# Patient Record
Sex: Female | Born: 1985 | Race: White | Hispanic: No | Marital: Married | Smoking: Never smoker
Health system: Southern US, Community
[De-identification: ages and names within clinical notes are randomized; demographics above are authoritative.]

## PROBLEM LIST (undated history)

## (undated) DIAGNOSIS — O2341 Unspecified infection of urinary tract in pregnancy, first trimester: Secondary | ICD-10-CM

## (undated) DIAGNOSIS — K08409 Partial loss of teeth, unspecified cause, unspecified class: Secondary | ICD-10-CM

## (undated) HISTORY — DX: Unspecified infection of urinary tract in pregnancy, first trimester: O23.41

## (undated) HISTORY — PX: WISDOM TOOTH EXTRACTION: SHX21

## (undated) HISTORY — PX: NO PAST SURGERIES: SHX2092

---
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
## 2011-04-16 ENCOUNTER — Emergency Department (HOSPITAL_COMMUNITY)
Admission: EM | Admit: 2011-04-16 | Discharge: 2011-04-16 | Disposition: A | Discharge status: Home / Self Care | Payer: 59 | Attending: Emergency Medicine | Admitting: Emergency Medicine

## 2011-04-16 ENCOUNTER — Encounter (HOSPITAL_COMMUNITY): Payer: Self-pay | Admitting: Family Medicine

## 2011-04-16 DIAGNOSIS — O039 Complete or unspecified spontaneous abortion without complication: Secondary | ICD-10-CM | POA: Insufficient documentation

## 2011-04-16 DIAGNOSIS — R350 Frequency of micturition: Secondary | ICD-10-CM | POA: Insufficient documentation

## 2011-04-16 DIAGNOSIS — O26859 Spotting complicating pregnancy, unspecified trimester: Secondary | ICD-10-CM | POA: Insufficient documentation

## 2011-04-16 DIAGNOSIS — R300 Dysuria: Secondary | ICD-10-CM | POA: Insufficient documentation

## 2011-04-16 LAB — URINE MICROSCOPIC-ADD ON

## 2011-04-16 LAB — ABO/RH: ABO/RH(D): AB POS

## 2011-04-16 LAB — URINALYSIS, ROUTINE W REFLEX MICROSCOPIC
Bilirubin Urine: NEGATIVE
Glucose, UA: NEGATIVE mg/dL
Ketones, ur: NEGATIVE mg/dL
Specific Gravity, Urine: 1.029 (ref 1.005–1.030)
pH: 6 (ref 5.0–8.0)

## 2011-04-16 LAB — HCG, QUANTITATIVE, PREGNANCY: hCG, Beta Chain, Quant, S: 5 m[IU]/mL — ABNORMAL HIGH (ref <5)

## 2011-04-16 MED ORDER — ONDANSETRON HCL 8 MG PO TABS: 8.0000 mg | ORAL_TABLET | Freq: Three times a day (TID) | ORAL | Status: AC | PRN

## 2011-04-16 NOTE — ED Notes (Signed)
Per pt having bleeding that started last night. sts started slow and then became more heavy. sts some small clots. sts she was cramping initially but has stopped.

## 2011-04-16 NOTE — Discharge Instructions (Signed)
Take zofran as needed for nausea.   Follow up with your gynecologist this week if possible.  You may return to the ER if you have any other concerns.

## 2011-04-16 NOTE — ED Notes (Signed)
Phlebotomy at bedside to draw pt blood.

## 2011-04-16 NOTE — ED Provider Notes (Signed)
History     CSN: [id number]  Arrival date & time 04/16/11  0920   First MD Initiated Contact with Patient 04/16/11 [phone]      Chief Complaint  Patient presents with  . Possible Pregnancy  . Vaginal Bleeding    (Consider location/radiation/quality/duration/timing/severity/associated sxs/prior treatment) HPI History provided by pt.   Pt found out she was pregnant via serum hcg at gynecologist office last week.  LMP 03/10/11.  Developed spotting at mid-night last night.  Passed once small clot this morning.  Has not needed to wear anything other than a panty-liner. No associated abd/pelvic/back pain or other vaginal sx.  Has had malodorous urine, dysuria and increased frequency as well.  This is patient's first pregnancy.    History reviewed. No pertinent past medical history.  History reviewed. No pertinent past surgical history.  History reviewed. No pertinent family history.  History  Substance Use Topics  . Smoking status: Never Smoker   . Smokeless tobacco: Not on file  . Alcohol Use: No    OB History    Grav Para Term Preterm Abortions TAB SAB Ect Mult Living   1               Review of Systems  All other systems reviewed and are negative.    Allergies  Review of patient's allergies indicates no known allergies.  Home Medications   Current Outpatient Rx  Name Route Sig Dispense Refill  . PRENATAL MULTIVITAMIN CH Oral Take 1 tablet by mouth daily.      BP 132/79  Pulse 86  Temp(Src) 97.9 F (36.6 C) (Oral)  Resp 16  SpO2 99%  LMP 03/10/2011  Physical Exam  Nursing note and vitals reviewed. Constitutional: She is oriented to person, place, and time. She appears well-developed and well-nourished. No distress.  HENT:  Head: Normocephalic and atraumatic.  Eyes:       Normal appearance  Neck: Normal range of motion.  Cardiovascular: Normal rate and regular rhythm.   Pulmonary/Chest: Effort normal and breath sounds normal.  Abdominal: Soft. Bowel sounds  are normal. She exhibits no distension and no mass. There is no tenderness. There is no rebound and no guarding.       No CVA tenderness  Genitourinary:       Nml external genitalia.  Small amt of vaginal bleeding.  Cervix closed and appears nml.  No adnexal or cervical motion tenderess.    Neurological: She is alert and oriented to person, place, and time.  Skin: Skin is warm and dry. No rash noted.  Psychiatric: She has a normal mood and affect. Her behavior is normal.    ED Course  Procedures (including critical care time)   Labs Reviewed  URINALYSIS, ROUTINE W REFLEX MICROSCOPIC  PREGNANCY, URINE   No results found.   1. Complete abortion       MDM  25yo healthy, pregnant F (pos serum quant last week; approx 5wks 2d based on LMP) presents w/ spotting since mid-night last night.  No associated cramping.   On exam, abdomen benign/non-tender, cervix closed, small amt of vaginal bleeding.  U/A pos for bacteria but dirty sample and no WBCs or leukocyte esterase.  Pregnancy test neg. Will check a serum quant and if positive, order transvaginal US to verify IUP.  Suspect implantation bleeding. 10:53 AM   Quant 5 today.  Pt likely had a miscarriage.  Discussed results with her.  Blood type AB+.  Discharged home with zofran d/t recent nausea and  referred her back to her gynecologist for recheck.         Arie Sabina [place], [place] 04/16/11 2109

## 2011-04-20 NOTE — ED Provider Notes (Signed)
Medical screening examination/treatment/procedure(s) were performed by non-physician practitioner and as supervising physician I was immediately available for consultation/collaboration.    Celene Kras, MD 04/20/11 2024

## 2012-02-11 NOTE — L&D Delivery Note (Signed)
I have seen and examined this patient and I agree with the above. I was present for 2nd and 3rd stages. Mary [name] 7:17 AM 12/02/2012

## 2012-02-11 NOTE — L&D Delivery Note (Signed)
Delivery Note At 6:22 AM a viable female was delivered via Vaginal, Spontaneous Delivery (Presentation: ; Occiput Anterior).  APGAR: 8, 9; weight 7 lb 1.4 oz (3215 g).   Placenta status: intact, Spontaneous.  Cord:  with the following complications: None.  Cord pH: not sent  Anesthesia: Epidural  Episiotomy: None Lacerations: 2nd degree;Perineal Suture Repair: 3.0 vicryl Est. Blood Loss (mL): 250  Mom to postpartum.  Baby to nursery-stable. Pt permitted to labor down. Delivered a viable baby girl after several hard pushes with instantaneous cry. Delivery was complicated by nuchal X1 which was reduced. Baby at first appeared floppy but apgars were 8,9. Third stage of labor managed with manual traction and pitocin. 2nd degree perineal tear repaired with 3.0 vicyrl. Hemostasis was achieved. Pt and baby both stable for transfer to postpartum. Counts were reviewed and were correct.   Anselm Lis 12/02/2012, 6:56 AM

## 2012-02-16 LAB — OB RESULTS CONSOLE GC/CHLAMYDIA: Gonorrhea: NEGATIVE

## 2012-04-14 LAB — OB RESULTS CONSOLE PLATELET COUNT: Platelets: 265 10*3/uL

## 2012-04-14 LAB — OB RESULTS CONSOLE HGB/HCT, BLOOD: HCT: 36 %

## 2012-04-14 LAB — OB RESULTS CONSOLE ANTIBODY SCREEN: Antibody Screen: NEGATIVE

## 2012-04-14 LAB — OB RESULTS CONSOLE ABO/RH: RH Type: POSITIVE

## 2012-04-14 LAB — OB RESULTS CONSOLE VARICELLA ZOSTER ANTIBODY, IGG: Varicella: IMMUNE

## 2012-05-14 ENCOUNTER — Encounter: Payer: Self-pay | Admitting: Adult Health

## 2012-05-14 DIAGNOSIS — O2341 Unspecified infection of urinary tract in pregnancy, first trimester: Secondary | ICD-10-CM

## 2012-05-14 HISTORY — DX: Unspecified infection of urinary tract in pregnancy, first trimester: O23.41

## 2012-05-19 ENCOUNTER — Other Ambulatory Visit: Payer: Self-pay | Admitting: Obstetrics & Gynecology

## 2012-05-19 ENCOUNTER — Ambulatory Visit (INDEPENDENT_AMBULATORY_CARE_PROVIDER_SITE_OTHER): Payer: 59 | Admitting: Obstetrics and Gynecology

## 2012-05-19 ENCOUNTER — Encounter: Payer: Self-pay | Admitting: Obstetrics and Gynecology

## 2012-05-19 ENCOUNTER — Encounter: Payer: Self-pay | Admitting: Advanced Practice Midwife

## 2012-05-19 VITALS — BP 104/70 | Wt 162.0 lb

## 2012-05-19 DIAGNOSIS — Z3490 Encounter for supervision of normal pregnancy, unspecified, unspecified trimester: Secondary | ICD-10-CM

## 2012-05-19 DIAGNOSIS — Z36 Encounter for antenatal screening of mother: Secondary | ICD-10-CM

## 2012-05-19 DIAGNOSIS — Z3491 Encounter for supervision of normal pregnancy, unspecified, first trimester: Secondary | ICD-10-CM

## 2012-05-19 DIAGNOSIS — O2390 Unspecified genitourinary tract infection in pregnancy, unspecified trimester: Secondary | ICD-10-CM

## 2012-05-19 DIAGNOSIS — O09299 Supervision of pregnancy with other poor reproductive or obstetric history, unspecified trimester: Secondary | ICD-10-CM

## 2012-05-19 DIAGNOSIS — O210 Mild hyperemesis gravidarum: Secondary | ICD-10-CM

## 2012-05-19 LAB — POCT URINALYSIS DIPSTICK
Ketones, UA: NEGATIVE
Protein, UA: NEGATIVE

## 2012-05-19 NOTE — Progress Notes (Signed)
Persistent n&V, but tolerating it well with lemonade and crackers.candy.

## 2012-05-19 NOTE — Progress Notes (Signed)
C/o nausea and vomiting

## 2012-05-26 ENCOUNTER — Encounter: Payer: Self-pay | Admitting: *Deleted

## 2012-06-02 ENCOUNTER — Other Ambulatory Visit: Payer: 59

## 2012-06-16 ENCOUNTER — Ambulatory Visit (INDEPENDENT_AMBULATORY_CARE_PROVIDER_SITE_OTHER): Payer: 59 | Admitting: Advanced Practice Midwife

## 2012-06-16 ENCOUNTER — Encounter: Payer: Self-pay | Admitting: Advanced Practice Midwife

## 2012-06-16 VITALS — BP 110/70 | Wt 161.5 lb

## 2012-06-16 DIAGNOSIS — Z3480 Encounter for supervision of other normal pregnancy, unspecified trimester: Secondary | ICD-10-CM | POA: Insufficient documentation

## 2012-06-16 DIAGNOSIS — Z3490 Encounter for supervision of normal pregnancy, unspecified, unspecified trimester: Secondary | ICD-10-CM

## 2012-06-16 DIAGNOSIS — O09299 Supervision of pregnancy with other poor reproductive or obstetric history, unspecified trimester: Secondary | ICD-10-CM

## 2012-06-16 DIAGNOSIS — Z1389 Encounter for screening for other disorder: Secondary | ICD-10-CM

## 2012-06-16 DIAGNOSIS — Z331 Pregnant state, incidental: Secondary | ICD-10-CM

## 2012-06-16 LAB — POCT URINALYSIS DIPSTICK
Glucose, UA: NEGATIVE
Nitrite, UA: NEGATIVE
Protein, UA: NEGATIVE

## 2012-06-16 NOTE — Progress Notes (Signed)
No c/o at this time. Feeling better with nausea.  Routine questions about pregnancy answered.  F/U in 5 weeks for anatomy scan and LROB.

## 2012-07-21 ENCOUNTER — Ambulatory Visit (INDEPENDENT_AMBULATORY_CARE_PROVIDER_SITE_OTHER): Payer: 59

## 2012-07-21 ENCOUNTER — Ambulatory Visit (INDEPENDENT_AMBULATORY_CARE_PROVIDER_SITE_OTHER): Payer: 59 | Admitting: Advanced Practice Midwife

## 2012-07-21 ENCOUNTER — Encounter: Payer: Self-pay | Admitting: Advanced Practice Midwife

## 2012-07-21 ENCOUNTER — Other Ambulatory Visit: Payer: Self-pay | Admitting: Advanced Practice Midwife

## 2012-07-21 VITALS — BP 104/80 | Wt 161.5 lb

## 2012-07-21 DIAGNOSIS — Z1389 Encounter for screening for other disorder: Secondary | ICD-10-CM

## 2012-07-21 DIAGNOSIS — O09299 Supervision of pregnancy with other poor reproductive or obstetric history, unspecified trimester: Secondary | ICD-10-CM

## 2012-07-21 DIAGNOSIS — Z3490 Encounter for supervision of normal pregnancy, unspecified, unspecified trimester: Secondary | ICD-10-CM

## 2012-07-21 DIAGNOSIS — Z331 Pregnant state, incidental: Secondary | ICD-10-CM

## 2012-07-21 LAB — POCT URINALYSIS DIPSTICK: Nitrite, UA: NEGATIVE

## 2012-07-21 NOTE — Progress Notes (Signed)
Having indigestion. TUMS makes her sick. Advised Mylanta.

## 2012-07-21 NOTE — Progress Notes (Signed)
U/s performed. Anatomy Screen Complete. All anat appears nml, nml fluid, cx closed =3.1 cm trans-abd., active, meas. C/w dates, ant high plac., bilat ovs seen, female fetus

## 2012-07-21 NOTE — Progress Notes (Signed)
Had anatomy scan today--normal scan, see notes.    No c/o at this time except indigestion.  Routine questions about pregnancy answered.  F/U in 4 weeks for LROB.

## 2012-07-26 DIAGNOSIS — Z029 Encounter for administrative examinations, unspecified: Secondary | ICD-10-CM

## 2012-08-17 ENCOUNTER — Encounter: Payer: Self-pay | Admitting: Women's Health

## 2012-08-17 ENCOUNTER — Ambulatory Visit (INDEPENDENT_AMBULATORY_CARE_PROVIDER_SITE_OTHER): Payer: 59 | Admitting: Women's Health

## 2012-08-17 VITALS — BP 100/60 | Wt 162.8 lb

## 2012-08-17 DIAGNOSIS — Z331 Pregnant state, incidental: Secondary | ICD-10-CM

## 2012-08-17 DIAGNOSIS — Z3490 Encounter for supervision of normal pregnancy, unspecified, unspecified trimester: Secondary | ICD-10-CM

## 2012-08-17 DIAGNOSIS — Z3482 Encounter for supervision of other normal pregnancy, second trimester: Secondary | ICD-10-CM

## 2012-08-17 DIAGNOSIS — Z1389 Encounter for screening for other disorder: Secondary | ICD-10-CM

## 2012-08-17 DIAGNOSIS — O1212 Gestational proteinuria, second trimester: Secondary | ICD-10-CM

## 2012-08-17 DIAGNOSIS — O09299 Supervision of pregnancy with other poor reproductive or obstetric history, unspecified trimester: Secondary | ICD-10-CM

## 2012-08-17 LAB — POCT URINALYSIS DIPSTICK: Nitrite, UA: NEGATIVE

## 2012-08-17 MED ORDER — PRENATAL MULTIVITAMIN CH: 1.0000 | ORAL_TABLET | Freq: Every day | ORAL | Status: DC

## 2012-08-17 NOTE — Progress Notes (Signed)
Boil on bottom. Bee sting on right wrist Saturday.

## 2012-08-17 NOTE — Progress Notes (Signed)
Reports good fm. Denies uc's, lof, vb, urinary frequency, urgency, hesitancy, or dysuria.  Bee sting Rt inner forearm- itches- recommended benadryl po or cream.  Boil Rt lower labia- app 2-3cm, already draining. Continue warm compresses. Reviewed ptl s/s, fm.  All questions answered. F/U in 4wks for PN2 and visit.

## 2012-08-17 NOTE — Patient Instructions (Addendum)
You will have your sugar test next visit.  Please do not eat or drink anything after midnight the night before you come, not even water.  You will be here for at least two hours.     Abscess An abscess is an infected area that contains a collection of pus and debris.It can occur in almost any part of the body. An abscess is also known as a furuncle or boil. CAUSES  An abscess occurs when tissue gets infected. This can occur from blockage of oil or sweat glands, infection of hair follicles, or a minor injury to the skin. As the body tries to fight the infection, pus collects in the area and creates pressure under the skin. This pressure causes pain. People with weakened immune systems have difficulty fighting infections and get certain abscesses more often.  SYMPTOMS Usually an abscess develops on the skin and becomes a painful mass that is red, warm, and tender. If the abscess forms under the skin, you may feel a moveable soft area under the skin. Some abscesses break open (rupture) on their own, but most will continue to get worse without care. The infection can spread deeper into the body and eventually into the bloodstream, causing you to feel ill.  DIAGNOSIS  Your caregiver will take your medical history and perform a physical exam. A sample of fluid may also be taken from the abscess to determine what is causing your infection. TREATMENT  Your caregiver may prescribe antibiotic medicines to fight the infection. However, taking antibiotics alone usually does not cure an abscess. Your caregiver may need to make a small cut (incision) in the abscess to drain the pus. In some cases, gauze is packed into the abscess to reduce pain and to continue draining the area. HOME CARE INSTRUCTIONS   Only take over-the-counter or prescription medicines for pain, discomfort, or fever as directed by your caregiver.  If you were prescribed antibiotics, take them as directed. Finish them even if you start to feel  better.  If gauze is used, follow your caregiver's directions for changing the gauze.  To avoid spreading the infection:  Keep your draining abscess covered with a bandage.  Wash your hands well.  Do not share personal care items, towels, or whirlpools with others.  Avoid skin contact with others.  Keep your skin and clothes clean around the abscess.  Keep all follow-up appointments as directed by your caregiver. SEEK MEDICAL CARE IF:   You have increased pain, swelling, redness, fluid drainage, or bleeding.  You have muscle aches, chills, or a general ill feeling.  You have a fever. MAKE SURE YOU:   Understand these instructions.  Will watch your condition.  Will get help right away if you are not doing well or get worse. Document Released: 11/06/2004 Document Revised: 07/29/2011 Document Reviewed: 04/11/2011 [facility] Patient Information 2014 [place], [place].

## 2012-08-18 LAB — URINALYSIS
Bilirubin Urine: NEGATIVE
Hgb urine dipstick: NEGATIVE
Ketones, ur: NEGATIVE mg/dL
Specific Gravity, Urine: 1.022 (ref 1.005–1.030)
Urobilinogen, UA: 1 mg/dL (ref 0.0–1.0)

## 2012-08-19 ENCOUNTER — Encounter: Payer: Self-pay | Admitting: Women's Health

## 2012-08-19 LAB — URINE CULTURE

## 2012-09-14 ENCOUNTER — Encounter: Payer: Self-pay | Admitting: Obstetrics & Gynecology

## 2012-09-14 ENCOUNTER — Other Ambulatory Visit: Payer: 59

## 2012-09-14 ENCOUNTER — Ambulatory Visit (INDEPENDENT_AMBULATORY_CARE_PROVIDER_SITE_OTHER): Payer: 59 | Admitting: Obstetrics & Gynecology

## 2012-09-14 VITALS — BP 100/60 | Wt 168.0 lb

## 2012-09-14 DIAGNOSIS — Z3480 Encounter for supervision of other normal pregnancy, unspecified trimester: Secondary | ICD-10-CM

## 2012-09-14 DIAGNOSIS — O09299 Supervision of pregnancy with other poor reproductive or obstetric history, unspecified trimester: Secondary | ICD-10-CM

## 2012-09-14 DIAGNOSIS — Z331 Pregnant state, incidental: Secondary | ICD-10-CM

## 2012-09-14 DIAGNOSIS — Z3482 Encounter for supervision of other normal pregnancy, second trimester: Secondary | ICD-10-CM

## 2012-09-14 DIAGNOSIS — Z1389 Encounter for screening for other disorder: Secondary | ICD-10-CM

## 2012-09-14 LAB — POCT URINALYSIS DIPSTICK
Blood, UA: NEGATIVE
Ketones, UA: NEGATIVE

## 2012-09-14 LAB — CBC
Hemoglobin: 11 g/dL — ABNORMAL LOW (ref 12.0–15.0)
MCV: 90.7 fL (ref 78.0–100.0)
Platelets: 221 10*3/uL (ref 150–400)
RBC: 3.55 MIL/uL — ABNORMAL LOW (ref 3.87–5.11)
WBC: 9.2 10*3/uL (ref 4.0–10.5)

## 2012-09-14 NOTE — Progress Notes (Signed)
FOR PN2 TODAY. HAVING SOME SWELLING IN LEFT FOOT.

## 2012-09-14 NOTE — Progress Notes (Signed)
Normal amount of variable swelling on left side. BP weight and urine results all reviewed and noted. Patient reports good fetal movement, denies any bleeding and no rupture of membranes symptoms or regular contractions. Patient is without complaints. All questions were answered.

## 2012-09-14 NOTE — Patient Instructions (Signed)
Breastfeeding A change in hormones during your pregnancy causes growth of your breast tissue and an increase in number and size of milk ducts. The hormone prolactin allows proteins, sugars, and fats from your blood supply to make breast milk in your milk-producing glands. The hormone progesterone prevents breast milk from being released before the birth of your baby. After the birth of your baby, your progesterone level decreases allowing breast milk to be released. Thoughts of your baby, as well as his or her sucking or crying, can stimulate the release of milk from the milk-producing glands. Deciding to breastfeed (nurse) is one of the best choices you can make for you and your baby. The information that follows gives a brief review of the benefits, as well as other important skills to know about breastfeeding. BENEFITS OF BREASTFEEDING For your baby  The first milk (colostrum) helps your baby's digestive system function better.   There are antibodies in your milk that help your baby fight off infections.   Your baby has a lower incidence of asthma, allergies, and sudden infant death syndrome (SIDS).   The nutrients in breast milk are better for your baby than infant formulas.  Breast milk improves your baby's brain development.   Your baby will have less gas, colic, and constipation.  Your baby is less likely to develop other conditions, such as childhood obesity, asthma, or diabetes mellitus. For you  Breastfeeding helps develop a very special bond between you and your baby.   Breastfeeding is convenient, always available at the correct temperature, and costs nothing.   Breastfeeding helps to burn calories and helps you lose the weight gained during pregnancy.   Breastfeeding makes your uterus contract back down to normal size faster and slows bleeding following delivery.   Breastfeeding mothers have a lower risk of developing osteoporosis or breast or ovarian cancer later in  life.  BREASTFEEDING FREQUENCY  A healthy, full-term baby may breastfeed as often as every hour or space his or her feedings to every 3 hours. Breastfeeding frequency will vary from baby to baby.   Newborns should be fed no less than every 2 3 hours during the day and every 4 5 hours during the night. You should breastfeed a minimum of 8 feedings in a 24 hour period.  Awaken your baby to breastfeed if it has been 3 4 hours since the last feeding.  Breastfeed when you feel the need to reduce the fullness of your breasts or when your newborn shows signs of hunger. Signs that your baby may be hungry include:  Increased alertness or activity.  Stretching.  Movement of the head from side to side.  Movement of the head and opening of the mouth when the corner of the mouth or cheek is stroked (rooting).  Increased sucking sounds, smacking lips, cooing, sighing, or squeaking.  Hand-to-mouth movements.  Increased sucking of fingers or hands.  Fussing.  Intermittent crying.  Signs of extreme hunger will require calming and consoling before you try to feed your baby. Signs of extreme hunger may include:  Restlessness.  A loud, strong cry.  Screaming.  Frequent feeding will help you make more milk and will help prevent problems, such as sore nipples and engorgement of the breasts.  BREASTFEEDING   Whether lying down or sitting, be sure that the baby's abdomen is facing your abdomen.   Support your breast with 4 fingers under your breast and your thumb above your nipple. Make sure your fingers are well away from  your nipple and your baby's mouth.   Stroke your baby's lips gently with your finger or nipple.   When your baby's mouth is open wide enough, place all of your nipple and as much of the colored area around your nipple (areola) as possible into your baby's mouth.  More areola should be visible above his or her upper lip than below his or her lower lip.  Your baby's  tongue should be between his or her lower gum and your breast.  Ensure that your baby's mouth is correctly positioned around the nipple (latched). Your baby's lips should create a seal on your breast.  Signs that your baby has effectively latched onto your nipple include:  Tugging or sucking without pain.  Swallowing heard between sucks.  Absent click or smacking sound.  Muscle movement above and in front of his or her ears with sucking.  Your baby must suck about 2 3 minutes in order to get your milk. Allow your baby to feed on each breast as long as he or she wants. Nurse your baby until he or she unlatches or falls asleep at the first breast, then offer the second breast.  Signs that your baby is full and satisfied include:  A gradual decrease in the number of sucks or complete cessation of sucking.  Falling asleep.  Extension or relaxation of his or her body.  Retention of a small amount of milk in his or her mouth.  Letting go of your breast by himself or herself.  Signs of effective breastfeeding in you include:  Breasts that have increased firmness, weight, and size prior to feeding.  Breasts that are softer after nursing.  Increased milk volume, as well as a change in milk consistency and color by the 5th day of breastfeeding.  Breast fullness relieved by breastfeeding.  Nipples are not sore, cracked, or bleeding.  If needed, break the suction by putting your finger into the corner of your baby's mouth and sliding your finger between his or her gums. Then, remove your breast from his or her mouth.  It is common for babies to spit up a small amount after a feeding.  Babies often swallow air during feeding. This can make babies fussy. Burping your baby between breasts can help with this.  Vitamin D supplements are recommended for babies who get only breast milk.  Avoid using a pacifier during your baby's first 4 6 weeks.  Avoid supplemental feedings of water,  formula, or juice in place of breastfeeding. Breast milk is all the food your baby needs. It is not necessary for your baby to have water or formula. Your breasts will make more milk if supplemental feedings are avoided during the early weeks. HOW TO TELL WHETHER YOUR BABY IS GETTING ENOUGH BREAST MILK Wondering whether or not your baby is getting enough milk is a common concern among mothers. You can be assured that your baby is getting enough milk if:   Your baby is actively sucking and you hear swallowing.   Your baby seems relaxed and satisfied after a feeding.   Your baby nurses at least 8 12 times in a 24 hour time period.  During the first 49 40 days of age:  Your baby is wetting at least 3 5 diapers in a 24 hour period. The urine should be clear and pale yellow.  Your baby is having at least 3 4 stools in a 24 hour period. The stool should be soft and yellow.  At  44 25 days of age, your baby is having at least 3 6 stools in a 24 hour period. The stool should be seedy and yellow by 44 days of age.  Your baby has a weight loss less than 7 10% during the first 78 days of age.  Your baby does not lose weight after 71 65 days of age.  Your baby gains 4 7 ounces each week after he or she is 35 days of age.  Your baby gains weight by 3 days of age and is back to birth weight within 2 weeks. ENGORGEMENT In the first week after your baby is born, you may experience extremely full breasts (engorgement). When engorged, your breasts may feel heavy, warm, or tender to the touch. Engorgement peaks within 24 48 hours after delivery of your baby.  Engorgement may be reduced by:  Continuing to breastfeed.  Increasing the frequency of breastfeeding.  Taking warm showers or applying warm, moist heat to your breasts just before each feeding. This increases circulation and helps the milk flow.   Gently massaging your breast before and during the feedings. With your fingertips, massage from your  chest wall towards your nipple in a circular motion.   Ensuring that your baby empties at least one breast at every feeding. It also helps to start the next feeding on the opposite breast.   Expressing breast milk by hand or by using a breast pump to empty the breasts if your baby is sleepy, or not nursing well. You may also want to express milk if you are returning to work oryou feel you are getting engorged.  Ensuring your baby is latched on and positioned properly while breastfeeding. If you follow these suggestions, your engorgement should improve in 24 48 hours. If you are still experiencing difficulty, call your lactation consultant or caregiver.  CARING FOR YOURSELF Take care of your breasts.  Bathe or shower daily.   Avoid using soap on your nipples.   Wear a supportive bra. Avoid wearing underwire style bras.  Air dry your nipples for a 3 after each feeding.   Use only cotton bra pads to absorb breast milk leakage. Leaking of breast milk between feedings is normal.   Use only pure lanolin on your nipples after nursing. You do not need to wash it off before feeding your baby again. Another option is to express a few drops of breast milk and gently massage that milk into your nipples.  Continue breast self-awareness checks. Take care of yourself.  Eat healthy foods. Alternate 3 meals with 3 snacks.  Avoid foods that you notice affect your baby in a bad way.  Drink milk, fruit juice, and water to satisfy your thirst (about 8 glasses a day).   Rest often, relax, and take your prenatal vitamins to prevent fatigue, stress, and anemia.  Avoid chewing and smoking tobacco.  Avoid alcohol and drug use.  Take over-the-counter and prescribed medicine only as directed by your caregiver or pharmacist. You should always check with your caregiver or pharmacist before taking any new medicine, vitamin, or herbal supplement.  Know that pregnancy is possible while  breastfeeding. If desired, talk to your caregiver about family planning and safe birth control methods that may be used while breastfeeding. SEEK MEDICAL CARE IF:   You feel like you want to stop breastfeeding or have become frustrated with breastfeeding.  You have painful breasts or nipples.  Your nipples are cracked or bleeding.  Your breasts are red, tender,  or warm.  You have a swollen area on either breast.  You have a fever or chills.  You have nausea or vomiting.  You have drainage from your nipples.  Your breasts do not become full before feedings by the 5th day after delivery.  You feel sad and depressed.  Your baby is too sleepy to eat well.  Your baby is having trouble sleeping.   Your baby is wetting less than 3 diapers in a 24 hour period.  Your baby has less than 3 stools in a 24 hour period.  Your baby's skin or the white part of his or her eyes becomes more yellow.   Your baby is not gaining weight by 34 days of age. MAKE SURE YOU:   Understand these instructions.  Will watch your condition.  Will get help right away if you are not doing well or get worse. Document Released: 01/27/2005 Document Revised: 10/22/2011 Document Reviewed: 09/03/2011 [facility] Patient Information 2014 [place], [place].

## 2012-09-14 NOTE — Addendum Note (Signed)
Addended by: Malachy Mood S on: 09/14/2012 11:30 AM   Modules accepted: Orders

## 2012-09-15 LAB — RPR

## 2012-09-15 LAB — GLUCOSE TOLERANCE, 2 HOURS W/ 1HR
Glucose, 1 hour: 78 mg/dL (ref 70–170)
Glucose, 2 hour: 114 mg/dL (ref 70–139)
Glucose, Fasting: 76 mg/dL (ref 70–99)

## 2012-09-23 ENCOUNTER — Encounter: Payer: Self-pay | Admitting: Obstetrics and Gynecology

## 2012-09-23 DIAGNOSIS — R768 Other specified abnormal immunological findings in serum: Secondary | ICD-10-CM | POA: Insufficient documentation

## 2012-10-05 ENCOUNTER — Encounter: Payer: 59 | Admitting: Women's Health

## 2012-10-07 ENCOUNTER — Encounter: Payer: Self-pay | Admitting: Advanced Practice Midwife

## 2012-10-07 ENCOUNTER — Ambulatory Visit (INDEPENDENT_AMBULATORY_CARE_PROVIDER_SITE_OTHER): Payer: 59 | Admitting: Advanced Practice Midwife

## 2012-10-07 VITALS — BP 100/64 | Wt 171.8 lb

## 2012-10-07 DIAGNOSIS — O99019 Anemia complicating pregnancy, unspecified trimester: Secondary | ICD-10-CM

## 2012-10-07 DIAGNOSIS — Z331 Pregnant state, incidental: Secondary | ICD-10-CM

## 2012-10-07 DIAGNOSIS — R768 Other specified abnormal immunological findings in serum: Secondary | ICD-10-CM

## 2012-10-07 DIAGNOSIS — O09299 Supervision of pregnancy with other poor reproductive or obstetric history, unspecified trimester: Secondary | ICD-10-CM

## 2012-10-07 DIAGNOSIS — Z3482 Encounter for supervision of other normal pregnancy, second trimester: Secondary | ICD-10-CM

## 2012-10-07 DIAGNOSIS — Z1389 Encounter for screening for other disorder: Secondary | ICD-10-CM

## 2012-10-07 LAB — POCT URINALYSIS DIPSTICK
Blood, UA: NEGATIVE
Glucose, UA: NEGATIVE
Ketones, UA: NEGATIVE

## 2012-10-07 NOTE — Progress Notes (Addendum)
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  Offered testing to him, as well, if interested. Tick bite on right flank 2 days ago.  No rash, fever.  Precautions given.   Routine questions about pregnancy answered.  F/U in 2 weeks for LROB.

## 2012-10-21 ENCOUNTER — Ambulatory Visit (INDEPENDENT_AMBULATORY_CARE_PROVIDER_SITE_OTHER): Payer: Self-pay | Admitting: Advanced Practice Midwife

## 2012-10-21 VITALS — BP 118/74 | Wt 174.0 lb

## 2012-10-21 DIAGNOSIS — Z1389 Encounter for screening for other disorder: Secondary | ICD-10-CM

## 2012-10-21 DIAGNOSIS — O99019 Anemia complicating pregnancy, unspecified trimester: Secondary | ICD-10-CM

## 2012-10-21 DIAGNOSIS — O09299 Supervision of pregnancy with other poor reproductive or obstetric history, unspecified trimester: Secondary | ICD-10-CM

## 2012-10-21 DIAGNOSIS — Z3482 Encounter for supervision of other normal pregnancy, second trimester: Secondary | ICD-10-CM

## 2012-10-21 LAB — POCT URINALYSIS DIPSTICK
Blood, UA: NEGATIVE
Nitrite, UA: NEGATIVE

## 2012-10-21 MED ORDER — ACYCLOVIR 400 MG PO TABS: 400.0000 mg | ORAL_TABLET | Freq: Three times a day (TID) | ORAL | Status: DC

## 2012-10-21 MED ORDER — PRENATAL MULTIVITAMIN CH: 1.0000 | ORAL_TABLET | Freq: Every day | ORAL | Status: DC

## 2012-10-21 NOTE — Progress Notes (Signed)
Will get flu shot at work.   Wants to go ahead and start taking acyclovir.    No c/o at this time.  Routine questions about pregnancy answered.  F/U in 2 weeks for LROB.

## 2012-10-21 NOTE — Addendum Note (Signed)
Addended by: Richardson Chiquito on: 10/21/2012 12:42 PM   Modules accepted: Orders

## 2012-11-03 ENCOUNTER — Ambulatory Visit (INDEPENDENT_AMBULATORY_CARE_PROVIDER_SITE_OTHER): Payer: Self-pay | Admitting: Advanced Practice Midwife

## 2012-11-03 ENCOUNTER — Encounter: Payer: Self-pay | Admitting: Advanced Practice Midwife

## 2012-11-03 VITALS — BP 102/78 | Wt 176.5 lb

## 2012-11-03 DIAGNOSIS — O99019 Anemia complicating pregnancy, unspecified trimester: Secondary | ICD-10-CM

## 2012-11-03 DIAGNOSIS — Z331 Pregnant state, incidental: Secondary | ICD-10-CM

## 2012-11-03 DIAGNOSIS — Z1389 Encounter for screening for other disorder: Secondary | ICD-10-CM

## 2012-11-03 DIAGNOSIS — R768 Other specified abnormal immunological findings in serum: Secondary | ICD-10-CM

## 2012-11-03 DIAGNOSIS — O09299 Supervision of pregnancy with other poor reproductive or obstetric history, unspecified trimester: Secondary | ICD-10-CM

## 2012-11-03 LAB — POCT URINALYSIS DIPSTICK
Blood, UA: NEGATIVE
Glucose, UA: NEGATIVE
Ketones, UA: NEGATIVE

## 2012-11-03 NOTE — Progress Notes (Signed)
Pt. Will still get flu shot at work. Continues to have indigestion and back pain. She does have times where she feels nauseated enough to throw up. She does take zantac for her heartburn and it seems to help a bit. F/U in 2 weeks for LROB.  Visit performed by Baron Sane, PA-S

## 2012-11-17 ENCOUNTER — Ambulatory Visit (INDEPENDENT_AMBULATORY_CARE_PROVIDER_SITE_OTHER): Payer: Self-pay | Admitting: Obstetrics and Gynecology

## 2012-11-17 ENCOUNTER — Encounter: Payer: Self-pay | Admitting: Obstetrics and Gynecology

## 2012-11-17 VITALS — BP 100/60 | Temp 98.4°F | Wt 179.0 lb

## 2012-11-17 DIAGNOSIS — N390 Urinary tract infection, site not specified: Secondary | ICD-10-CM

## 2012-11-17 DIAGNOSIS — Z1389 Encounter for screening for other disorder: Secondary | ICD-10-CM

## 2012-11-17 DIAGNOSIS — O2390 Unspecified genitourinary tract infection in pregnancy, unspecified trimester: Secondary | ICD-10-CM

## 2012-11-17 LAB — POCT URINALYSIS DIPSTICK
Ketones, UA: NEGATIVE
Leukocytes, UA: NEGATIVE

## 2012-11-17 MED ORDER — CEPHALEXIN 500 MG PO CAPS: 500.0000 mg | ORAL_CAPSULE | Freq: Four times a day (QID) | ORAL | Status: DC

## 2012-11-17 NOTE — Patient Instructions (Signed)
Culture ordered, will be back in 5 days. Begin keflex 4 times daily

## 2012-11-17 NOTE — Progress Notes (Signed)
Mid-back discomfort at end of long workdays in [place] [employer]. -CVAT, u/a +  trace protein 2+ leukocytes no fever IMP; musculoskeletal discomforts , not Pyelo           R/o uti   Will do C&S, begin Keflex as precaution

## 2012-11-17 NOTE — Progress Notes (Signed)
C/C Back Pain x 1 week.

## 2012-11-18 LAB — URINALYSIS
Nitrite: POSITIVE — AB
Protein, ur: NEGATIVE mg/dL
Urobilinogen, UA: 1 mg/dL (ref 0.0–1.0)

## 2012-11-20 LAB — URINE CULTURE: Colony Count: >=100000

## 2012-11-23 ENCOUNTER — Ambulatory Visit (INDEPENDENT_AMBULATORY_CARE_PROVIDER_SITE_OTHER): Payer: 59 | Admitting: Women's Health

## 2012-11-23 ENCOUNTER — Ambulatory Visit (INDEPENDENT_AMBULATORY_CARE_PROVIDER_SITE_OTHER): Payer: 59

## 2012-11-23 ENCOUNTER — Other Ambulatory Visit: Payer: Self-pay | Admitting: Women's Health

## 2012-11-23 VITALS — BP 102/70 | Wt 175.0 lb

## 2012-11-23 DIAGNOSIS — O9989 Other specified diseases and conditions complicating pregnancy, childbirth and the puerperium: Secondary | ICD-10-CM

## 2012-11-23 DIAGNOSIS — O0993 Supervision of high risk pregnancy, unspecified, third trimester: Secondary | ICD-10-CM

## 2012-11-23 DIAGNOSIS — Z3480 Encounter for supervision of other normal pregnancy, unspecified trimester: Secondary | ICD-10-CM

## 2012-11-23 DIAGNOSIS — Z3483 Encounter for supervision of other normal pregnancy, third trimester: Secondary | ICD-10-CM

## 2012-11-23 DIAGNOSIS — O288 Other abnormal findings on antenatal screening of mother: Secondary | ICD-10-CM | POA: Insufficient documentation

## 2012-11-23 LAB — OB RESULTS CONSOLE GC/CHLAMYDIA
Chlamydia: NEGATIVE
Gonorrhea: NEGATIVE

## 2012-11-23 NOTE — Progress Notes (Signed)
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  Reports good fm, denies uc's, vb, lof.  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 Strip reviewed w/ [facility], no need for further monitoring at [facility], to get AFI. AFI=4.3cm, also scanned by [facility]. Reviewed warning s/s to report, labor s/s, fkc. F/U Fri for NST and visit, then Tues for afi/bpp u/s and visit.

## 2012-11-23 NOTE — Patient Instructions (Signed)
Fetal Movement Counts Patient Name: __________________________________________________ Patient Due Date: ____________________ Performing a fetal movement count is highly recommended in high-risk pregnancies, but it is good for every pregnant woman to do. Your caregiver may ask you to start counting fetal movements at 28 weeks of the pregnancy. Fetal movements often increase:  After eating a full meal.  After physical activity.  After eating or drinking something sweet or cold.  At rest. Pay attention to when you feel the baby is most active. This will help you notice a pattern of your baby's sleep and wake cycles and what factors contribute to an increase in fetal movement. It is important to perform a fetal movement count at the same time each day when your baby is normally most active.  HOW TO COUNT FETAL MOVEMENTS 1. Find a quiet and comfortable area to sit or lie down on your left side. Lying on your left side provides the best blood and oxygen circulation to your baby. 2. Write down the day and time on a sheet of paper or in a journal. 3. Start counting kicks, flutters, swishes, rolls, or jabs in a 2 hour period. You should feel at least 10 movements within 2 hours. 4. If you do not feel 10 movements in 2 hours, wait 2 3 hours and count again. Look for a change in the pattern or not enough counts in 2 hours. SEEK MEDICAL CARE IF:  You feel less than 10 counts in 2 hours, tried twice.  There is no movement in over an hour.  The pattern is changing or taking longer each day to reach 10 counts in 2 hours.  You feel the baby is not moving as he or she usually does. Date: ____________ Movements: ____________ Start time: ____________ Finish time: ____________  Date: ____________ Movements: ____________ Start time: ____________ Finish time: ____________ Date: ____________ Movements: ____________ Start time: ____________ Finish time: ____________ Date: ____________ Movements: ____________  Start time: ____________ Finish time: ____________ Date: ____________ Movements: ____________ Start time: ____________ Finish time: ____________ Date: ____________ Movements: ____________ Start time: ____________ Finish time: ____________ Date: ____________ Movements: ____________ Start time: ____________ Finish time: ____________ Date: ____________ Movements: ____________ Start time: ____________ Finish time: ____________  Date: ____________ Movements: ____________ Start time: ____________ Finish time: ____________ Date: ____________ Movements: ____________ Start time: ____________ Finish time: ____________ Date: ____________ Movements: ____________ Start time: ____________ Finish time: ____________ Date: ____________ Movements: ____________ Start time: ____________ Finish time: ____________ Date: ____________ Movements: ____________ Start time: ____________ Finish time: ____________ Date: ____________ Movements: ____________ Start time: ____________ Finish time: ____________ Date: ____________ Movements: ____________ Start time: ____________ Finish time: ____________  Date: ____________ Movements: ____________ Start time: ____________ Finish time: ____________ Date: ____________ Movements: ____________ Start time: ____________ Finish time: ____________ Date: ____________ Movements: ____________ Start time: ____________ Finish time: ____________ Date: ____________ Movements: ____________ Start time: ____________ Finish time: ____________ Date: ____________ Movements: ____________ Start time: ____________ Finish time: ____________ Date: ____________ Movements: ____________ Start time: ____________ Finish time: ____________ Date: ____________ Movements: ____________ Start time: ____________ Finish time: ____________  Date: ____________ Movements: ____________ Start time: ____________ Finish time: ____________ Date: ____________ Movements: ____________ Start time: ____________ Finish time:  ____________ Date: ____________ Movements: ____________ Start time: ____________ Finish time: ____________ Date: ____________ Movements: ____________ Start time: ____________ Finish time: ____________ Date: ____________ Movements: ____________ Start time: ____________ Finish time: ____________ Date: ____________ Movements: ____________ Start time: ____________ Finish time: ____________ Date: ____________ Movements: ____________ Start time: ____________ Finish time: ____________  Date: ____________ Movements: ____________ Start time: ____________ Finish   time: ____________ Date: ____________ Movements: ____________ Start time: ____________ Finish time: ____________ Date: ____________ Movements: ____________ Start time: ____________ Finish time: ____________ Date: ____________ Movements: ____________ Start time: ____________ Finish time: ____________ Date: ____________ Movements: ____________ Start time: ____________ Finish time: ____________ Date: ____________ Movements: ____________ Start time: ____________ Finish time: ____________ Date: ____________ Movements: ____________ Start time: ____________ Finish time: ____________  Date: ____________ Movements: ____________ Start time: ____________ Finish time: ____________ Date: ____________ Movements: ____________ Start time: ____________ Finish time: ____________ Date: ____________ Movements: ____________ Start time: ____________ Finish time: ____________ Date: ____________ Movements: ____________ Start time: ____________ Finish time: ____________ Date: ____________ Movements: ____________ Start time: ____________ Finish time: ____________ Date: ____________ Movements: ____________ Start time: ____________ Finish time: ____________ Date: ____________ Movements: ____________ Start time: ____________ Finish time: ____________  Date: ____________ Movements: ____________ Start time: ____________ Finish time: ____________ Date: ____________ Movements:  ____________ Start time: ____________ Finish time: ____________ Date: ____________ Movements: ____________ Start time: ____________ Finish time: ____________ Date: ____________ Movements: ____________ Start time: ____________ Finish time: ____________ Date: ____________ Movements: ____________ Start time: ____________ Finish time: ____________ Date: ____________ Movements: ____________ Start time: ____________ Finish time: ____________ Date: ____________ Movements: ____________ Start time: ____________ Finish time: ____________  Date: ____________ Movements: ____________ Start time: ____________ Finish time: ____________ Date: ____________ Movements: ____________ Start time: ____________ Finish time: ____________ Date: ____________ Movements: ____________ Start time: ____________ Finish time: ____________ Date: ____________ Movements: ____________ Start time: ____________ Finish time: ____________ Date: ____________ Movements: ____________ Start time: ____________ Finish time: ____________ Date: ____________ Movements: ____________ Start time: ____________ Finish time: ____________ Document Released: 02/26/2006 Document Revised: 01/14/2012 Document Reviewed: 11/24/2011 [employer] Patient Information 2014 [employer]  

## 2012-11-23 NOTE — Progress Notes (Signed)
Pt fell getting out of her vehicle, pt states fell on her back, no vaginal bleeding, +FM. C/o lower back pain. NST today.

## 2012-11-23 NOTE — Progress Notes (Signed)
U/S(37+1wks)-vtx active female fetus, AFI-4.3 and 4.8cm, (borderline oligohydramnios), EFW 6 lb 8 oz (38th%tile) anterior placenta Gr 3

## 2012-11-23 NOTE — Progress Notes (Signed)
U/S(37+1wks)-vtx, active fetus, EFW 6 lb 8 oz (38th%tile), AFI-4.3 & 4.8cm oligohydramnios, ant gr 2 plac, female fetus

## 2012-11-24 ENCOUNTER — Encounter: Payer: 59 | Admitting: Women's Health

## 2012-11-24 ENCOUNTER — Encounter: Payer: Self-pay | Admitting: Women's Health

## 2012-11-24 LAB — GC/CHLAMYDIA PROBE AMP
CT Probe RNA: NEGATIVE
GC Probe RNA: NEGATIVE

## 2012-11-25 ENCOUNTER — Encounter: Payer: Self-pay | Admitting: Women's Health

## 2012-11-25 LAB — STREP B DNA PROBE: GBSP: POSITIVE

## 2012-11-26 ENCOUNTER — Ambulatory Visit (INDEPENDENT_AMBULATORY_CARE_PROVIDER_SITE_OTHER): Payer: 59 | Admitting: Obstetrics & Gynecology

## 2012-11-26 ENCOUNTER — Telehealth: Payer: Self-pay | Admitting: Obstetrics & Gynecology

## 2012-11-26 VITALS — BP 110/80 | Wt 184.0 lb

## 2012-11-26 DIAGNOSIS — Z1389 Encounter for screening for other disorder: Secondary | ICD-10-CM

## 2012-11-26 DIAGNOSIS — O9989 Other specified diseases and conditions complicating pregnancy, childbirth and the puerperium: Secondary | ICD-10-CM

## 2012-11-26 DIAGNOSIS — O09299 Supervision of pregnancy with other poor reproductive or obstetric history, unspecified trimester: Secondary | ICD-10-CM

## 2012-11-26 DIAGNOSIS — O98519 Other viral diseases complicating pregnancy, unspecified trimester: Secondary | ICD-10-CM

## 2012-11-26 DIAGNOSIS — O99019 Anemia complicating pregnancy, unspecified trimester: Secondary | ICD-10-CM

## 2012-11-26 DIAGNOSIS — O4190X1 Disorder of amniotic fluid and membranes, unspecified, unspecified trimester, fetus 1: Secondary | ICD-10-CM

## 2012-11-26 DIAGNOSIS — Z331 Pregnant state, incidental: Secondary | ICD-10-CM

## 2012-11-26 LAB — POCT URINALYSIS DIPSTICK
Blood, UA: NEGATIVE
Glucose, UA: NEGATIVE
Ketones, UA: NEGATIVE
Nitrite, UA: NEGATIVE

## 2012-11-26 NOTE — Progress Notes (Signed)
Reactive NST sono tuesday BP weight and urine results all reviewed and noted. Patient reports good fetal movement, denies any bleeding and no rupture of membranes symptoms or regular contractions. Patient is without complaints. All questions were answered.

## 2012-11-26 NOTE — Progress Notes (Deleted)
FOR NST.

## 2012-11-26 NOTE — Progress Notes (Deleted)
C/o sharp pain in back and upper abdomen, NST today

## 2012-11-26 NOTE — Telephone Encounter (Signed)
Note fixed per Dr. Despina Hidden and pt aware to come by office and pick up.

## 2012-11-29 ENCOUNTER — Other Ambulatory Visit: Payer: Self-pay | Admitting: Obstetrics & Gynecology

## 2012-11-29 DIAGNOSIS — O4103X1 Oligohydramnios, third trimester, fetus 1: Secondary | ICD-10-CM

## 2012-11-30 ENCOUNTER — Ambulatory Visit (INDEPENDENT_AMBULATORY_CARE_PROVIDER_SITE_OTHER): Payer: 59

## 2012-11-30 ENCOUNTER — Inpatient Hospital Stay (HOSPITAL_COMMUNITY)
Admission: AD | Admit: 2012-11-30 | Discharge: 2012-12-03 | DRG: 775 | Disposition: A | Discharge status: Home / Self Care | Payer: 59 | Source: Ambulatory Visit | Attending: Obstetrics and Gynecology | Admitting: Obstetrics and Gynecology

## 2012-11-30 ENCOUNTER — Encounter: Payer: Self-pay | Admitting: Obstetrics & Gynecology

## 2012-11-30 ENCOUNTER — Encounter (HOSPITAL_COMMUNITY): Payer: Self-pay

## 2012-11-30 ENCOUNTER — Ambulatory Visit (INDEPENDENT_AMBULATORY_CARE_PROVIDER_SITE_OTHER): Payer: 59 | Admitting: Obstetrics & Gynecology

## 2012-11-30 VITALS — BP 100/80 | Wt 181.0 lb

## 2012-11-30 DIAGNOSIS — Z22330 Carrier of Group B streptococcus: Secondary | ICD-10-CM

## 2012-11-30 DIAGNOSIS — O4100X1 Oligohydramnios, unspecified trimester, fetus 1: Secondary | ICD-10-CM

## 2012-11-30 DIAGNOSIS — O9989 Other specified diseases and conditions complicating pregnancy, childbirth and the puerperium: Secondary | ICD-10-CM

## 2012-11-30 DIAGNOSIS — O98519 Other viral diseases complicating pregnancy, unspecified trimester: Secondary | ICD-10-CM

## 2012-11-30 DIAGNOSIS — O4100X0 Oligohydramnios, unspecified trimester, not applicable or unspecified: Secondary | ICD-10-CM | POA: Insufficient documentation

## 2012-11-30 DIAGNOSIS — R768 Other specified abnormal immunological findings in serum: Secondary | ICD-10-CM

## 2012-11-30 DIAGNOSIS — Z3483 Encounter for supervision of other normal pregnancy, third trimester: Secondary | ICD-10-CM

## 2012-11-30 DIAGNOSIS — O2390 Unspecified genitourinary tract infection in pregnancy, unspecified trimester: Secondary | ICD-10-CM

## 2012-11-30 DIAGNOSIS — O288 Other abnormal findings on antenatal screening of mother: Secondary | ICD-10-CM

## 2012-11-30 DIAGNOSIS — O4103X1 Oligohydramnios, third trimester, fetus 1: Secondary | ICD-10-CM

## 2012-11-30 DIAGNOSIS — O09299 Supervision of pregnancy with other poor reproductive or obstetric history, unspecified trimester: Secondary | ICD-10-CM

## 2012-11-30 DIAGNOSIS — O210 Mild hyperemesis gravidarum: Secondary | ICD-10-CM

## 2012-11-30 DIAGNOSIS — O99892 Other specified diseases and conditions complicating childbirth: Secondary | ICD-10-CM | POA: Diagnosis present

## 2012-11-30 DIAGNOSIS — Z1389 Encounter for screening for other disorder: Secondary | ICD-10-CM

## 2012-11-30 HISTORY — DX: Partial loss of teeth, unspecified cause, unspecified class: K08.409

## 2012-11-30 LAB — POCT URINALYSIS DIPSTICK
Blood, UA: NEGATIVE
Ketones, UA: NEGATIVE
Protein, UA: NEGATIVE

## 2012-11-30 LAB — CBC
Hemoglobin: 10.6 g/dL — ABNORMAL LOW (ref 12.0–15.0)
MCV: 86 fL (ref 78.0–100.0)
Platelets: 232 10*3/uL (ref 150–400)
RBC: 3.49 MIL/uL — ABNORMAL LOW (ref 3.87–5.11)
RDW: 13.3 % (ref 11.5–15.5)
WBC: 11.3 10*3/uL — ABNORMAL HIGH (ref 4.0–10.5)

## 2012-11-30 MED ORDER — MISOPROSTOL 25 MCG QUARTER TABLET
25.0000 ug | ORAL_TABLET | ORAL | Status: DC | PRN
  Administered 2012-11-30 – 2012-12-01 (×2): 25 ug via VAGINAL
  Filled 2012-11-30 (×2): qty 0.25
  Filled 2012-11-30: qty 1

## 2012-11-30 MED ORDER — OXYCODONE-ACETAMINOPHEN 5-325 MG PO TABS: 1.0000 | ORAL_TABLET | ORAL | Status: DC | PRN

## 2012-11-30 MED ORDER — ONDANSETRON HCL 4 MG/2ML IJ SOLN: 4.0000 mg | Freq: Four times a day (QID) | INTRAMUSCULAR | Status: DC | PRN

## 2012-11-30 MED ORDER — LACTATED RINGERS IV SOLN
500.0000 mL | INTRAVENOUS | Status: DC | PRN
  Administered 2012-12-01: 300 mL via INTRAVENOUS

## 2012-11-30 MED ORDER — ACETAMINOPHEN 325 MG PO TABS: 650.0000 mg | ORAL_TABLET | ORAL | Status: DC | PRN

## 2012-11-30 MED ORDER — CITRIC ACID-SODIUM CITRATE 334-500 MG/5ML PO SOLN: 30.0000 mL | ORAL | Status: DC | PRN

## 2012-11-30 MED ORDER — LACTATED RINGERS IV SOLN
INTRAVENOUS | Status: DC
  Administered 2012-12-01 – 2012-12-02 (×5): via INTRAVENOUS

## 2012-11-30 MED ORDER — TERBUTALINE SULFATE 1 MG/ML IJ SOLN: 0.2500 mg | Freq: Once | INTRAMUSCULAR | Status: AC | PRN

## 2012-11-30 MED ORDER — IBUPROFEN 600 MG PO TABS
600.0000 mg | ORAL_TABLET | Freq: Four times a day (QID) | ORAL | Status: DC | PRN
  Administered 2012-12-02: 600 mg via ORAL
  Filled 2012-11-30: qty 1

## 2012-11-30 MED ORDER — OXYTOCIN 40 UNITS IN LACTATED RINGERS INFUSION - SIMPLE MED
62.5000 mL/h | INTRAVENOUS | Status: DC
  Filled 2012-11-30: qty 1000

## 2012-11-30 MED ORDER — LIDOCAINE HCL (PF) 1 % IJ SOLN
30.0000 mL | INTRAMUSCULAR | Status: DC | PRN
  Filled 2012-11-30 (×2): qty 30

## 2012-11-30 MED ORDER — PENICILLIN G POTASSIUM 5000000 UNITS IJ SOLR
2.5000 10*6.[IU] | INTRAVENOUS | Status: DC
  Administered 2012-12-01 – 2012-12-02 (×4): 2.5 10*6.[IU] via INTRAVENOUS
  Filled 2012-11-30 (×10): qty 2.5

## 2012-11-30 MED ORDER — PENICILLIN G POTASSIUM 5000000 UNITS IJ SOLR
5.0000 10*6.[IU] | Freq: Once | INTRAVENOUS | Status: AC
  Administered 2012-12-01: 5 10*6.[IU] via INTRAVENOUS
  Filled 2012-11-30: qty 5

## 2012-11-30 MED ORDER — OXYTOCIN BOLUS FROM INFUSION
500.0000 mL | INTRAVENOUS | Status: DC
  Administered 2012-12-02: 500 mL via INTRAVENOUS

## 2012-11-30 NOTE — Progress Notes (Addendum)
U/S(38+1wks)- vtx non-active fetus BPP 4/8 (unable to view tone and limb movement), UA Doppler RI 0.57 & 0.54, AFI-4.5cm, ant gr 3 plac, female fetus, appears to be NUCHAL CORDx1

## 2012-11-30 NOTE — Progress Notes (Signed)
D. Poe notified of admission status, SVE, FHR tracing, Ucs, and positive GBS. Provider to place orders

## 2012-11-30 NOTE — Progress Notes (Signed)
BPP 4/8(no gross body, no extension/flexion) AFI 4.58(Pocket 2.71) being monitored for idiopathic oligohydramnios picked up as part of an evaluation after a fall.  Has had reassuring testing until now and has excellent Doppler flow today.  Discussed with Dr Lyn Records behalf of Dr Erin Fulling) as well as Leola Brazil, CNM and they all agree with cervical ripening followed by induction.

## 2012-11-30 NOTE — Progress Notes (Signed)
No complaints today.  Feeling well.  No loss of fluid, vaginal bleeding, abnormal discharge.  She reports irregular UC.   Oligo on [place], BPP 4/8.   Patient to go to [facility] for induction.

## 2012-11-30 NOTE — Progress Notes (Signed)
Follow-Up U/S.

## 2012-11-30 NOTE — H&P (Signed)
Mary [name] is a 27 y.o. female G2P0010 at [redacted]w[redacted]d presenting for cervical ripening/IOL indicated by oligohydramnios. PNC at [facility] with FATs for borderline idiopathic oligohydramnios and had [place] today. BPP 4/8 and AFI 4.58, normal Dopplers.    Maternal Medical History:  Reason for admission: Nausea. oligo  Contractions: none  Fetal activity: Perceived fetal activity is normal.    Prenatal complications: Oligohydramnios.   Prenatal Complications - Diabetes: none.    OB History   Grav Para Term Preterm Abortions TAB SAB Ect Mult Living   2    1  1         Past Medical History  Diagnosis Date  . UTI (urinary tract infection) in pregnancy in first trimester 05/14/2012  . Wisdom teeth extracted 2007   Past Surgical History  Procedure Laterality Date  . No past surgeries     Family History: family history includes Asthma in her father; COPD in her father; Cancer in her maternal grandfather; Diabetes in her mother; Hypertension in her mother; Stroke in her maternal grandfather. Social History:  reports that she has never smoked. She has never used smokeless tobacco. She reports that she does not drink alcohol or use illicit drugs.   Prenatal Transfer Tool  Maternal Diabetes: No Genetic Screening: Normal Maternal Ultrasounds/Referrals: Normal Fetal Ultrasounds or other Referrals:  None Maternal Substance Abuse:  No Significant Maternal Medications:  Meds include: Other: PCN IV in labor Significant Maternal Lab Results:  Lab values include: Group B Strep positive Other Comments:  None  Review of Systems  Constitutional: Negative for fever.  Respiratory: Negative for cough.   Gastrointestinal: Negative for heartburn, nausea and abdominal pain.  Genitourinary: Negative for dysuria.  Neurological: Negative for headaches.  Psychiatric/Behavioral: Negative for depression.      Blood pressure 110/80, pulse 83, temperature 98.6 F (37 C), temperature source Oral, resp. rate 18, height  5\' 3"  (1.6 m), weight 82.101 kg (181 lb), last menstrual period 03/04/2012. Maternal Exam:  Uterine Assessment: Contraction strength is mild.  Contraction frequency is rare.   Abdomen: Estimated fetal weight is 7#.   Fetal presentation: vertex  Introitus: Ferning test: not done.  Nitrazine test: not done. Amniotic fluid character: not assessed.  Pelvis: adequate for delivery.   Cervix: Cx L/C per Dr. Despina Hidden today  Fetal Exam Fetal Monitor Review: Mode: ultrasound.   Baseline rate: 130.  Variability: moderate (6-25 bpm).   Pattern: accelerations present and no decelerations.    Fetal State Assessment: Category I - tracings are normal.     Physical Exam  Constitutional: She is oriented to person, place, and time. She appears well-developed and well-nourished.  HENT:  Head: Normocephalic.  Eyes: Pupils are equal, round, and reactive to light.  Neck: Normal range of motion. No thyromegaly present.  Cardiovascular: Normal rate, regular rhythm and normal heart sounds.   Respiratory: Effort normal and breath sounds normal.  GI: Soft. There is no tenderness.  Musculoskeletal: Normal range of motion.  Neurological: She is alert and oriented to person, place, and time. She has normal reflexes.  Skin: Skin is warm and dry.  Psychiatric: She has a normal mood and affect. Her behavior is normal. Judgment and thought content normal.    Prenatal labs: ABO, Rh: AB/Positive/-- (03/05 0000) Antibody: NEG (08/05 0948) Rubella: Nonimmune (03/05 0000) RPR: NON REAC (08/05 0948)  HBsAg: Negative (03/05 0000)  HIV: NON REACTIVE (08/05 0948)  GBS: POSITIVE (10/14 1555)  2 hr glucola: 76-78-114  Assessment/Plan: G2P0010 at [redacted]w[redacted]d Oligohydramnios Unfavorable cx>  proceed with cytotec cx ripening   Mary [name] 11/30/2012, 7:26 PM

## 2012-11-30 NOTE — Progress Notes (Signed)
Mary [name] is a 27 y.o. G2P0010 at [redacted]w[redacted]d.  Subjective: No UC's  Objective: BP 123/74  Pulse 80  Temp(Src) 99.4 F (37.4 C) (Oral)  Resp 18  Ht 5\' 3"  (1.6 m)  Wt 82.101 kg (181 lb)  BMI 32.07 kg/m2  LMP 03/04/2012      FHT:  FHR: 140 bpm, variability: moderate,  accelerations:  Present,  decelerations:  Present few mild variables UC:   none SVE:   Dilation: Fingertip Effacement (%): Thick Station: -2 Exam by:: A.Davis,RN  Labs: Lab Results  Component Value Date   WBC 11.3* 11/30/2012   HGB 10.6* 11/30/2012   HCT 30.0* 11/30/2012   MCV 86.0 11/30/2012   PLT 232 11/30/2012    Assessment / Plan: Induction of labor due to oligo,  progressing well on pitocin  Labor: IOL w/ cytotec. Preeclampsia:  NA Fetal Wellbeing:  Category I Pain Control:  Labor support without medications I/D:  n/a Anticipated MOD:  NSVD  Mary [name] 11/30/2012, 10:12 PM

## 2012-11-30 NOTE — H&P (Signed)
Mary [name] is a 27 y.o. female G2P0010 at [redacted]w[redacted]d presenting for cervical ripening/IOL indicated by oligohydramnios. [facility]NC at [facility] with FATs for borderline idiopathic oligohydramnios and had [place] today. BPP 4/8 (-2  History OB History   Grav Para Term Preterm Abortions TAB SAB Ect Mult Living   2    1  1         Past Medical History  Diagnosis Date  . UTI (urinary tract infection) in pregnancy in first trimester 05/14/2012  . Wisdom teeth extracted 2007   Past Surgical History  Procedure Laterality Date  . No past surgeries     Family History: family history includes Asthma in her father; COPD in her father; Cancer in her maternal grandfather; Diabetes in her mother; Hypertension in her mother; Stroke in her maternal grandfather. Social History:  reports that she has never smoked. She has never used smokeless tobacco. She reports that she does not drink alcohol or use illicit drugs.   Prenatal Transfer Tool  Maternal Diabetes: No Genetic Screening: Normal Maternal Ultrasounds/Referrals: Normal Fetal Ultrasounds or other Referrals:  None Maternal Substance Abuse:  No Significant Maternal Medications:  None Significant Maternal Lab Results:  None Other Comments:  None  ROS    Blood pressure 110/80, pulse 83, temperature 98.6 F (37 C), temperature source Oral, resp. rate 18, height 5\' 3"  (1.6 m), weight 82.101 kg (181 lb), last menstrual period 03/04/2012. Exam Physical Exam  Prenatal labs: ABO, Rh: AB/Positive/-- (03/05 0000) Antibody: NEG (08/05 0948) Rubella: Nonimmune (03/05 0000) RPR: NON REAC (08/05 0948)  HBsAg: Negative (03/05 0000)  HIV: NON REACTIVE (08/05 0948)  GBS: POSITIVE (10/14 1555)    Assessment/Plan: Oligohydramnios at term>admit for cx ripening/IOL   Mary [name] 11/30/2012, 7:02 PM

## 2012-12-01 ENCOUNTER — Encounter (HOSPITAL_COMMUNITY): Payer: Self-pay

## 2012-12-01 MED ORDER — ZOLPIDEM TARTRATE 5 MG PO TABS
5.0000 mg | ORAL_TABLET | Freq: Every evening | ORAL | Status: DC | PRN
  Administered 2012-12-01 (×2): 5 mg via ORAL
  Filled 2012-12-01 (×2): qty 1

## 2012-12-01 MED ORDER — OXYTOCIN 40 UNITS IN LACTATED RINGERS INFUSION - SIMPLE MED
1.0000 m[IU]/min | INTRAVENOUS | Status: DC
  Administered 2012-12-01: 2 m[IU]/min via INTRAVENOUS

## 2012-12-01 MED ORDER — TERBUTALINE SULFATE 1 MG/ML IJ SOLN: 0.2500 mg | Freq: Once | INTRAMUSCULAR | Status: AC | PRN

## 2012-12-01 NOTE — Progress Notes (Signed)
Mary [name] is a 27 y.o. G2P0010 at [redacted]w[redacted]d  admitted for induction of labor due to Low amniotic fluid..  Subjective:  Pt doing well. Not feeling contractions very much. +FM.   Objective: BP 119/77  Pulse 59  Temp(Src) 98.4 F (36.9 C) (Oral)  Resp 18  Ht 5\' 3"  (1.6 m)  Wt 82.101 kg (181 lb)  BMI 32.07 kg/m2  LMP 03/04/2012      FHT:  FHR: 140 bpm, variability: moderate,  accelerations:  Present,  decelerations:  Absent UC:   irregular, every 2-5 minutes SVE:   Dilation: 1 Effacement (%): 20 Station: -2 Exam by:: Dr. Michail Jewels  Labs: Lab Results  Component Value Date   WBC 11.3* 11/30/2012   HGB 10.6* 11/30/2012   HCT 30.0* 11/30/2012   MCV 86.0 11/30/2012   PLT 232 11/30/2012    Assessment / Plan: IOL due to oligo. s/p cytotec x2. FB in place  Labor: contracting on her own with foley bulb in place. cont to monitor Fetal Wellbeing:  Category I Pain Control:  Labor support without medications I/D:  GBS +- will start PCN in active labor Anticipated MOD:  NSVD  Mary [name] 12/01/2012, 10:41 AM

## 2012-12-01 NOTE — Progress Notes (Signed)
Clelia Croft, CNM notified of FHR tracing and uc pattern, no further orders at this time

## 2012-12-01 NOTE — Progress Notes (Signed)
Mary [name] is a 27 y.o. G2P0010 at [redacted]w[redacted]d  admitted for IOL due to oligo  Subjective:  FB just fell out. No vb, lof.  +FM. Having irregular contractions.   Objective: BP 117/77  Pulse 74  Temp(Src) 98.8 F (37.1 C) (Oral)  Resp 18  Ht 5\' 3"  (1.6 m)  Wt 82.101 kg (181 lb)  BMI 32.07 kg/m2  LMP 03/04/2012      FHT:  FHR: 145 bpm, variability: moderate,  accelerations:  Present,  decelerations:  Present occasional variable UC:   irregular SVE:   Dilation: 3 Effacement (%): 50 Station: -3 Exam by:: Reola Calkins, MD  Labs: Lab Results  Component Value Date   WBC 11.3* 11/30/2012   HGB 10.6* 11/30/2012   HCT 30.0* 11/30/2012   MCV 86.0 11/30/2012   PLT 232 11/30/2012    Assessment / Plan: IOL due to oligo, s/p foley bulb and cytotec.   Labor: start pitocin 2x2.   Fetal Wellbeing:  Category I Pain Control:  Epidural I/D:  GBS +- will start PCN now Anticipated MOD:  NSVD  Mary [name] 12/01/2012, 2:13 PM

## 2012-12-01 NOTE — Progress Notes (Signed)
Mary [name] is a 27 y.o. G2P0010 at [redacted]w[redacted]d   Subjective: Mostly comfortable; beginning to feel a little crampy  Objective: BP 126/81  Pulse 87  Temp(Src) 98.5 F (36.9 C) (Oral)  Resp 20  Ht 5\' 3"  (1.6 m)  Wt 82.101 kg (181 lb)  BMI 32.07 kg/m2  LMP 03/04/2012      FHT:  FHR: 130 bpm, variability: moderate,  accelerations:  Present,  decelerations:  Absent UC:   irregular, every 3-6 minutes with Pitocin @ 12 mu/min SVE:   Dilation: 4 Effacement (%): 50 Station: -2 Exam by:: Renaldo Harrison, RN- exam deferred  Labs: Lab Results  Component Value Date   WBC 11.3* 11/30/2012   HGB 10.6* 11/30/2012   HCT 30.0* 11/30/2012   MCV 86.0 11/30/2012   PLT 232 11/30/2012    Assessment / Plan: IOL process Oligo  Continue to increase Pitocin to achieve reg ctx  Cam Hai 12/01/2012, 8:09 PM

## 2012-12-01 NOTE — Progress Notes (Signed)
Mary [name] is a 27 y.o. G2P0010 at [redacted]w[redacted]d admitted for IOL 2/2 oligo  Subjective: No complaints, resting quietly in bed, not feeling contraction pain  Objective: BP 122/78  Pulse 85  Temp(Src) 99.1 F (37.3 C) (Oral)  Resp 18  Ht 5\' 3"  (1.6 m)  Wt 82.101 kg (181 lb)  BMI 32.07 kg/m2  LMP 03/04/2012      FHT:  FHR: 145 bpm, variability: moderate,  accelerations:  Present,  decelerations:  Absent UC:   irregular, every 4-6 minutes SVE:   Dilation: 1 Effacement (%): 20 Station: -2 Exam by:: Dr. Michail Jewels  Labs: Lab Results  Component Value Date   WBC 11.3* 11/30/2012   HGB 10.6* 11/30/2012   HCT 30.0* 11/30/2012   MCV 86.0 11/30/2012   PLT 232 11/30/2012    Assessment / Plan: Induction of labor due to oligo  Labor: early stages, s/p vaginal cytotec Preeclampsia:  n/a Fetal Wellbeing:  Category I Pain Control:  Labor support without medications I/D:  n/a, plan to start PCN if SROM, onset of active labor Anticipated MOD:  NSVD  Anselm Lis 12/01/2012, 12:38 AM

## 2012-12-02 ENCOUNTER — Encounter (HOSPITAL_COMMUNITY): Payer: Self-pay | Admitting: Anesthesiology

## 2012-12-02 ENCOUNTER — Encounter (HOSPITAL_COMMUNITY): Payer: 59 | Admitting: Anesthesiology

## 2012-12-02 ENCOUNTER — Inpatient Hospital Stay (HOSPITAL_COMMUNITY): Payer: 59 | Admitting: Anesthesiology

## 2012-12-02 DIAGNOSIS — O9989 Other specified diseases and conditions complicating pregnancy, childbirth and the puerperium: Secondary | ICD-10-CM

## 2012-12-02 DIAGNOSIS — O4100X0 Oligohydramnios, unspecified trimester, not applicable or unspecified: Secondary | ICD-10-CM

## 2012-12-02 MED ORDER — DIBUCAINE 1 % RE OINT: 1.0000 "application " | TOPICAL_OINTMENT | RECTAL | Status: DC | PRN

## 2012-12-02 MED ORDER — FENTANYL 2.5 MCG/ML BUPIVACAINE 1/10 % EPIDURAL INFUSION (WH - ANES)
INTRAMUSCULAR | Status: DC | PRN
  Administered 2012-12-02: 14 mL/h via EPIDURAL

## 2012-12-02 MED ORDER — PHENYLEPHRINE 40 MCG/ML (10ML) SYRINGE FOR IV PUSH (FOR BLOOD PRESSURE SUPPORT)
80.0000 ug | PREFILLED_SYRINGE | INTRAVENOUS | Status: DC | PRN
  Filled 2012-12-02: qty 5

## 2012-12-02 MED ORDER — PHENYLEPHRINE 40 MCG/ML (10ML) SYRINGE FOR IV PUSH (FOR BLOOD PRESSURE SUPPORT): 80.0000 ug | PREFILLED_SYRINGE | INTRAVENOUS | Status: DC | PRN

## 2012-12-02 MED ORDER — LIDOCAINE HCL (PF) 1 % IJ SOLN
INTRAMUSCULAR | Status: DC | PRN
  Administered 2012-12-02 (×2): 4 mL

## 2012-12-02 MED ORDER — FENTANYL 2.5 MCG/ML BUPIVACAINE 1/10 % EPIDURAL INFUSION (WH - ANES)
14.0000 mL/h | INTRAMUSCULAR | Status: DC | PRN
  Filled 2012-12-02: qty 125

## 2012-12-02 MED ORDER — ONDANSETRON HCL 4 MG PO TABS: 4.0000 mg | ORAL_TABLET | ORAL | Status: DC | PRN

## 2012-12-02 MED ORDER — OXYCODONE-ACETAMINOPHEN 5-325 MG PO TABS: 1.0000 | ORAL_TABLET | ORAL | Status: DC | PRN

## 2012-12-02 MED ORDER — TETANUS-DIPHTH-ACELL PERTUSSIS 5-2.5-18.5 LF-MCG/0.5 IM SUSP
0.5000 mL | Freq: Once | INTRAMUSCULAR | Status: AC
  Administered 2012-12-03: 0.5 mL via INTRAMUSCULAR

## 2012-12-02 MED ORDER — SENNOSIDES-DOCUSATE SODIUM 8.6-50 MG PO TABS
2.0000 | ORAL_TABLET | ORAL | Status: DC
  Filled 2012-12-02: qty 2

## 2012-12-02 MED ORDER — EPHEDRINE 5 MG/ML INJ
10.0000 mg | INTRAVENOUS | Status: DC | PRN
  Filled 2012-12-02: qty 4

## 2012-12-02 MED ORDER — LACTATED RINGERS IV SOLN: 500.0000 mL | Freq: Once | INTRAVENOUS | Status: DC

## 2012-12-02 MED ORDER — LANOLIN HYDROUS EX OINT: TOPICAL_OINTMENT | CUTANEOUS | Status: DC | PRN

## 2012-12-02 MED ORDER — EPHEDRINE 5 MG/ML INJ: 10.0000 mg | INTRAVENOUS | Status: DC | PRN

## 2012-12-02 MED ORDER — COMPLETENATE 29-1 MG PO CHEW
1.0000 | CHEWABLE_TABLET | Freq: Every day | ORAL | Status: DC
  Administered 2012-12-02: 1 via ORAL
  Filled 2012-12-02 (×3): qty 1

## 2012-12-02 MED ORDER — IBUPROFEN 100 MG/5ML PO SUSP
600.0000 mg | Freq: Four times a day (QID) | ORAL | Status: DC
  Administered 2012-12-02: 600 mg via ORAL
  Filled 2012-12-02 (×8): qty 30

## 2012-12-02 MED ORDER — PRENATAL MULTIVITAMIN CH
1.0000 | ORAL_TABLET | Freq: Every day | ORAL | Status: DC
  Filled 2012-12-02: qty 1

## 2012-12-02 MED ORDER — SIMETHICONE 80 MG PO CHEW: 80.0000 mg | CHEWABLE_TABLET | ORAL | Status: DC | PRN

## 2012-12-02 MED ORDER — BENZOCAINE-MENTHOL 20-0.5 % EX AERO
1.0000 "application " | INHALATION_SPRAY | CUTANEOUS | Status: DC | PRN
  Filled 2012-12-02: qty 56

## 2012-12-02 MED ORDER — ZOLPIDEM TARTRATE 5 MG PO TABS: 5.0000 mg | ORAL_TABLET | Freq: Every evening | ORAL | Status: DC | PRN

## 2012-12-02 MED ORDER — DIPHENHYDRAMINE HCL 25 MG PO CAPS: 25.0000 mg | ORAL_CAPSULE | Freq: Four times a day (QID) | ORAL | Status: DC | PRN

## 2012-12-02 MED ORDER — IBUPROFEN 600 MG PO TABS
600.0000 mg | ORAL_TABLET | Freq: Four times a day (QID) | ORAL | Status: DC
  Administered 2012-12-02 (×2): 600 mg via ORAL
  Filled 2012-12-02 (×2): qty 1

## 2012-12-02 MED ORDER — FENTANYL CITRATE 0.05 MG/ML IJ SOLN
100.0000 ug | INTRAMUSCULAR | Status: DC | PRN
  Administered 2012-12-02: 100 ug via INTRAVENOUS
  Filled 2012-12-02: qty 2

## 2012-12-02 MED ORDER — ONDANSETRON HCL 4 MG/2ML IJ SOLN: 4.0000 mg | INTRAMUSCULAR | Status: DC | PRN

## 2012-12-02 MED ORDER — WITCH HAZEL-GLYCERIN EX PADS: 1.0000 "application " | MEDICATED_PAD | CUTANEOUS | Status: DC | PRN

## 2012-12-02 MED ORDER — DIPHENHYDRAMINE HCL 50 MG/ML IJ SOLN: 12.5000 mg | INTRAMUSCULAR | Status: DC | PRN

## 2012-12-02 NOTE — Anesthesia Preprocedure Evaluation (Signed)
Anesthesia Evaluation  Patient identified by MRN, date of birth, ID band Patient awake    Reviewed: Allergy & Precautions, H&P , Patient's Chart, lab work & pertinent test results  Airway Mallampati: III TM Distance: >3 FB Neck ROM: Full    Dental no notable dental hx. (+) Teeth Intact   Pulmonary neg pulmonary ROS,  breath sounds clear to auscultation  Pulmonary exam normal       Cardiovascular negative cardio ROS  Rhythm:Regular Rate:Normal     Neuro/Psych negative neurological ROS  negative psych ROS   GI/Hepatic Neg liver ROS, GERD-  Medicated and Controlled,  Endo/Other  negative endocrine ROS  Renal/GU negative Renal ROS  negative genitourinary   Musculoskeletal negative musculoskeletal ROS (+)   Abdominal (+) + obese,   Peds  Hematology  (+) Blood dyscrasia, anemia ,   Anesthesia Other Findings   Reproductive/Obstetrics (+) Pregnancy HSV                           Anesthesia Physical Anesthesia Plan  ASA: II  Anesthesia Plan: Epidural   Post-op Pain Management:    Induction:   Airway Management Planned: Natural Airway  Additional Equipment:   Intra-op Plan:   Post-operative Plan:   Informed Consent: I have reviewed the patients History and Physical, chart, labs and discussed the procedure including the risks, benefits and alternatives for the proposed anesthesia with the patient or authorized representative who has indicated his/her understanding and acceptance.     Plan Discussed with: Anesthesiologist  Anesthesia Plan Comments:         Anesthesia Quick Evaluation

## 2012-12-02 NOTE — Anesthesia Postprocedure Evaluation (Signed)
  Anesthesia Post-op Note  Patient: Mary [name]  Procedure(s) Performed: * No procedures listed *  Patient Location: PACU and Mother/Baby  Anesthesia Type:Epidural  Level of Consciousness: awake, alert , oriented and patient cooperative  Airway and Oxygen Therapy: Patient Spontanous Breathing  Post-op Pain: none  Post-op Assessment: Post-op Vital signs reviewed, Patient's Cardiovascular Status Stable, Respiratory Function Stable, No signs of Nausea or vomiting, Adequate PO intake and Pain level controlled  Post-op Vital Signs: Reviewed and stable  Complications: No apparent anesthesia complications

## 2012-12-02 NOTE — Anesthesia Procedure Notes (Signed)
Epidural Patient location during procedure: OB Start time: 12/02/2012 2:16 AM  Staffing Anesthesiologist: Ellie Bryand A. Performed by: anesthesiologist   Preanesthetic Checklist Completed: patient identified, site marked, surgical consent, pre-op evaluation, timeout performed, IV checked, risks and benefits discussed and monitors and equipment checked  Epidural Patient position: sitting Prep: site prepped and draped and DuraPrep Patient monitoring: continuous pulse ox and blood pressure Approach: midline Injection technique: LOR air  Needle:  Needle type: Tuohy  Needle gauge: 17 G Needle length: 9 cm and 9 Needle insertion depth: 4 cm Catheter type: closed end flexible Catheter size: 19 Gauge Catheter at skin depth: 9 cm Test dose: negative and Other  Assessment Events: blood not aspirated, injection not painful, no injection resistance, negative IV test and no paresthesia  Additional Notes Patient identified. Risks and benefits discussed including failed block, incomplete  Pain control, post dural puncture headache, nerve damage, paralysis, blood pressure Changes, nausea, vomiting, reactions to medications-both toxic and allergic and post Partum back pain. All questions were answered. Patient expressed understanding and wished to proceed. Sterile technique was used throughout procedure. Epidural site was Dressed with sterile barrier dressing. No paresthesias, signs of intravascular injection Or signs of intrathecal spread were encountered.  Patient was more comfortable after the epidural was dosed. Please see RN's note for documentation of vital signs and FHR which are stable.

## 2012-12-02 NOTE — Lactation Note (Signed)
This note was copied from the chart of Mary [name]. Lactation Consultation Note  Patient Name: Mary [name] EPPIR'J Date: 12/02/2012 Reason for consult: Initial assessment of this primipara and her new baby at 57 hours of age.  Mom attended prenatal BF classes at [facility] and she is relaxed and holding baby at time of visit.  Mom states that her nurse has shown her hand expression.  LC ecnouraged cue feeding and frequent STS to help stimulate baby to breastfeed.  Baby did have a feeding of 15 minutes at 1815 but no update to [facility] score recorded.  LC provided [employer] brochure and reviewed [facility] services and list of community and web site resources.  LC encouraged mom to review: Baby and Me pp 14 and 20-25 for STS and BF information.    Maternal Data Formula Feeding for Exclusion: No Infant to breast within first hour of birth: Yes (initial LATCH score=3 due to baby being sleepy and unable to latch) Has patient been taught Hand Expression?: Yes (mom states she was shown by her nurse how to express colostrum) Does the patient have breastfeeding experience prior to this delivery?: No  Feeding    LATCH Score/Interventions         Sleepy at initial feeding (LATCH score=3)             Lactation Tools Discussed/Used   STS, cue feeding Signs of proper latch (used teaching tool with pictures of steps to proper latch:nose to nipple)  Consult Status Consult Status: Follow-up Date: 12/03/12 Follow-up type: In-patient    Mary [name] [facility] 12/02/2012, 10:58 PM

## 2012-12-03 MED ORDER — IBUPROFEN 100 MG/5ML PO SUSP: 600.0000 mg | Freq: Four times a day (QID) | ORAL | Status: DC

## 2012-12-03 MED ORDER — NORETHINDRONE 0.35 MG PO TABS: 1.0000 | ORAL_TABLET | Freq: Every day | ORAL | Status: DC

## 2012-12-03 NOTE — H&P (Signed)
Attestation of Attending Supervision of Advanced Practitioner (CNM/NP): Evaluation and management procedures were performed by the Advanced Practitioner under my supervision and collaboration.  I have reviewed the Advanced Practitioner's note and chart, and I agree with the management and plan.  HARRAWAY-SMITH, Meleane Selinger 12:03 PM     

## 2012-12-03 NOTE — Discharge Summary (Signed)
Obstetric Discharge Summary Reason for Admission: induction of labor due to oligohydramnios and non-reassurring antenatal testing (BPP 4/8) Prenatal Procedures: NST Intrapartum Procedures: spontaneous vaginal delivery and GBS prophylaxis Postpartum Procedures: none Complications-Operative and Postpartum: 2nd degree perineal laceration Hemoglobin  Date Value Range Status  11/30/2012 10.6* 12.0 - 15.0 g/dL Final  02/15/1094 04.5   Final     HCT  Date Value Range Status  11/30/2012 30.0* 36.0 - 46.0 % Final  04/14/2012 36   Final    Physical Exam:  General: alert, cooperative and no distress Lochia: appropriate Uterine Fundus: firm Incision: n/a DVT Evaluation: No evidence of DVT seen on physical exam.  Discharge Diagnoses: Term Pregnancy-delivered  Discharge Information: Date: 12/03/2012 Activity: pelvic rest Diet: routine Medications: Ibuprofen Condition: stable Instructions: refer to practice specific booklet Discharge to: home Follow-up Information   Follow up with [facility]. Schedule an appointment as soon as possible for a visit in 4 weeks. (for postpartum visit)    Contact information:   [address] [place] [address] [phone]      Newborn Data: Live born female  Birth Weight: 7 lb 1.4 oz (3215 g) APGAR: 8, 9  Home with mother.  Hospital course: Pt was admitted for IOL due to oligohydramnios and non-reassurring antenatal testing (BPP 4/8). She is GBS positive and received adequate treatment with Penicillin. Cervical ripening was achieved with Misoprostol and her labor was augmented with Pitocin. She progressed normally and had a NSVD. Delivery was complicated by 2nd degree perineal laceration which was repaired in the usual fashion. Her postpartum course was uncomplicated. She is AB positive and did not require Rhogam. She is breastfeeding. She desires oral contraceptive pills for contraception and was provided with a prescription for  Micronor. She will follow up at [facility] for her postpartum visit.    [name], Mary 12/03/2012, 8:07 AM  I have seen and examined this patient and agree the above assessment. [name],Mary [name] 12/07/2012 11:27 AM

## 2012-12-06 NOTE — H&P (Signed)
Attestation of Attending Supervision of Advanced Practitioner (CNM/NP): Evaluation and management procedures were performed by the Advanced Practitioner under my supervision and collaboration.  I have reviewed the Advanced Practitioner's note and chart, and I agree with the management and plan.  HARRAWAY-SMITH, Paiden Caraveo 10:46 AM     

## 2012-12-07 NOTE — Discharge Summary (Signed)
Attestation of Attending Supervision of Advanced Practitioner (CNM/NP): Evaluation and management procedures were performed by the Advanced Practitioner under my supervision and collaboration. I have reviewed the Advanced Practitioner's note and chart, and I agree with the management and plan.  Magen Suriano H. 10:01 PM

## 2013-01-18 ENCOUNTER — Ambulatory Visit (INDEPENDENT_AMBULATORY_CARE_PROVIDER_SITE_OTHER): Payer: 59 | Admitting: Women's Health

## 2013-01-18 ENCOUNTER — Encounter: Payer: Self-pay | Admitting: Women's Health

## 2013-01-18 VITALS — BP 112/68 | Ht 64.0 in | Wt 169.8 lb

## 2013-01-18 DIAGNOSIS — Z3480 Encounter for supervision of other normal pregnancy, unspecified trimester: Secondary | ICD-10-CM

## 2013-01-18 DIAGNOSIS — Z309 Encounter for contraceptive management, unspecified: Secondary | ICD-10-CM

## 2013-01-18 MED ORDER — LEVONORGESTREL-ETHINYL ESTRAD 0.1-20 MG-MCG PO TABS: 1.0000 | ORAL_TABLET | Freq: Every day | ORAL | Status: DC

## 2013-01-18 NOTE — Progress Notes (Signed)
Patient ID: Mary [name], female   DOB: [birth year], 27 y.o.   MRN: [id number] Subjective:    Mary [name] is a 27 y.o. [id number] Caucasian female who presents for a postpartum visit. She is 6.5 weeks postpartum following a spontaneous vaginal delivery at 38.3 gestational weeks after IOL d/t oligohydramnios w/ BPP 4/8. Anesthesia: epidural. I have fully reviewed the prenatal and intrapartum course. Postpartum course has been uncomplicated. Baby's course has been uncomplicated. Baby is feeding by bottle. Bleeding lochia x 3 weeks, then stopped. Started menses Sat.. Bowel function is normal. Bladder function is normal. Patient is not sexually active. Contraception method is began taking COCs 1wk ago that she had from before pregnancy. Requests refill. . Postpartum depression screening: negative. Score 1.   The following portions of the patient's history were reviewed and updated as appropriate: allergies, current medications, past medical history, past surgical history and problem list.  Review of Systems Pertinent items are noted in HPI.   Filed Vitals:   01/18/13 0913  BP: 112/68  Height: 5\' 4"  (1.626 m)  Weight: 169 lb 12.8 oz (77.021 kg)    Objective:     General:  alert, cooperative and no distress   Breasts:  deferred, no complaints  Lungs: clear to auscultation bilaterally  Heart:  regular rate and rhythm  Abdomen: soft, nontender   Vulva: normal  Vagina: normal vagina, on menses  Cervix:  closed  Corpus: Well-involuted  Adnexa:  Non-palpable  Rectal Exam: No hemorrhoids        Assessment:   Postpartum exam 6.5 wks s/p SVD, after IOL d/t oligohydramnios and BPP 4/8 Depression screening Contraception counseling   Plan:   Contraception: OCP (estrogen/progesterone) Alesse, rx w/ refill x 11months given Needs pap&physical March 2015, usually sees [name] at [facility], pt to call to schedule.   Marge Duncans 01/18/2013 9:21 AM

## 2013-01-18 NOTE — Patient Instructions (Signed)
Please call [name] to schedule your pap smear and physical for March 2015.   Oral Contraception Use Oral contraceptive pills (OCPs) are medicines taken to prevent pregnancy. OCPs work by preventing the ovaries from releasing eggs. The hormones in OCPs also cause the cervical mucus to thicken, preventing the sperm from entering the uterus. The hormones also cause the uterine lining to become thin, not allowing a fertilized egg to attach to the inside of the uterus. OCPs are highly effective when taken exactly as prescribed. However, OCPs do not prevent sexually transmitted diseases (STDs). Safe sex practices, such as using condoms along with an OCP, can help prevent STDs. Before taking OCPs, you may have a physical exam and Pap test. Your health care provider may also order blood tests if necessary. Your health care provider will make sure you are a good candidate for oral contraception. Discuss with your health care provider the possible side effects of the OCP you may be prescribed. When starting an OCP, it can take 2 to 3 months for the body to adjust to the changes in hormone levels in your body.  HOW TO TAKE ORAL CONTRACEPTIVE PILLS Your health care provider may advise you on how to start taking the first cycle of OCPs. Otherwise, you can:   Start on day 1 of your menstrual period. You will not need any backup contraceptive protection with this start time.   Start on the first Sunday after your menstrual period or the day you get your prescription. In these cases, you will need to use backup contraceptive protection for the first week.   Start the pill at any time of your cycle. If you take the pill within 5 days of the start of your period, you are protected against pregnancy right away. In this case, you will not need a backup form of birth control. If you start at any other time of your menstrual cycle, you will need to use another form of birth control for 7 days. If your OCP is the type  called a minipill, it will protect you from pregnancy after taking it for 2 days (48 hours). After you have started taking OCPs:   If you forget to take 1 pill, take it as soon as you remember. Take the next pill at the regular time.   If you miss 2 or more pills, call your health care provider because different pills have different instructions for missed doses. Use backup birth control until your next menstrual period starts.   If you use a 28-day pack that contains inactive pills and you miss 1 of the last 7 pills (pills with no hormones), it will not matter. Throw away the rest of the nonhormone pills and start a new pill pack.  No matter which day you start the OCP, you will always start a new pack on that same day of the week. Have an extra pack of OCPs and a backup contraceptive method available in case you miss some pills or lose your OCP pack.  HOME CARE INSTRUCTIONS   Do not smoke.   Always use a condom to protect against STDs. OCPs do not protect against STDs.   Use a calendar to mark your menstrual period days.   Read the information and directions that came with your OCP. Talk to your health care provider if you have questions.  SEEK MEDICAL CARE IF:   You develop nausea and vomiting.   You have abnormal vaginal discharge or bleeding.   You  develop a rash.   You miss your menstrual period.   You are losing your hair.   You need treatment for mood swings or depression.   You get dizzy when taking the OCP.   You develop acne from taking the OCP.   You become pregnant.  SEEK IMMEDIATE MEDICAL CARE IF:   You develop chest pain.   You develop shortness of breath.   You have an uncontrolled or severe headache.   You develop numbness or slurred speech.   You develop visual problems.   You develop pain, redness, and swelling in the legs.  Document Released: 01/16/2011 Document Revised: 09/29/2012 Document Reviewed: 07/18/2012 [facility]  Patient Information 2014 [place], [place].

## 2013-04-15 ENCOUNTER — Encounter: Payer: Self-pay | Admitting: Nurse Practitioner

## 2013-04-15 ENCOUNTER — Ambulatory Visit (INDEPENDENT_AMBULATORY_CARE_PROVIDER_SITE_OTHER): Payer: 59 | Admitting: Nurse Practitioner

## 2013-04-15 VITALS — BP 110/78 | Ht 64.0 in | Wt 159.6 lb

## 2013-04-15 DIAGNOSIS — Z0000 Encounter for general adult medical examination without abnormal findings: Secondary | ICD-10-CM

## 2013-04-15 DIAGNOSIS — Z01419 Encounter for gynecological examination (general) (routine) without abnormal findings: Secondary | ICD-10-CM

## 2013-04-15 DIAGNOSIS — Z124 Encounter for screening for malignant neoplasm of cervix: Secondary | ICD-10-CM

## 2013-04-15 DIAGNOSIS — Z309 Encounter for contraceptive management, unspecified: Secondary | ICD-10-CM

## 2013-04-15 DIAGNOSIS — IMO0001 Reserved for inherently not codable concepts without codable children: Secondary | ICD-10-CM

## 2013-04-15 MED ORDER — MEDROXYPROGESTERONE ACETATE 150 MG/ML IM SUSP
150.0000 mg | Freq: Once | INTRAMUSCULAR | Status: AC
  Administered 2013-04-15: 150 mg via INTRAMUSCULAR

## 2013-04-16 ENCOUNTER — Encounter: Payer: Self-pay | Admitting: Nurse Practitioner

## 2013-04-16 NOTE — Progress Notes (Signed)
   Subjective:    Patient ID: Mary [name], female    DOB: 11/19/1985, 28 y.o.   MRN: [id number]  HPI presents for her preventive health physical and Pap smear. Had her postpartum visit with her gynecologist yesterday but elects to get her physical here. Currently on birth control pills but would like to switch to Depo-Provera. Did have one missed pill this past pack. Has an infant at home. Is not breast-feeding. Just had her menses this week, finished yesterday. Same sexual partner. Last eye exam was about 2 years ago. Regular dental care. No regular exercise but has done fairly well with her diet.    Review of Systems  Constitutional: Negative for activity change, appetite change and fatigue.  HENT: Positive for rhinorrhea. Negative for dental problem, ear pain, sinus pressure and sore throat.   Respiratory: Negative for cough, chest tightness, shortness of breath and wheezing.   Cardiovascular: Negative for chest pain.  Gastrointestinal: Negative for nausea, vomiting, abdominal pain, diarrhea and constipation.  Genitourinary: Negative for dysuria, urgency, frequency, vaginal discharge, difficulty urinating, genital sores, menstrual problem and pelvic pain.       Objective:   Physical Exam  Vitals reviewed. Constitutional: She is oriented to person, place, and time. She appears well-developed. No distress.  HENT:  Right Ear: External ear normal.  Left Ear: External ear normal.  Mouth/Throat: Oropharynx is clear and moist.  Neck: Normal range of motion. Neck supple. No tracheal deviation present. No thyromegaly present.  Cardiovascular: Normal rate, regular rhythm and normal heart sounds.  Exam reveals no gallop.   No murmur heard. Pulmonary/Chest: Effort normal and breath sounds normal.  Abdominal: Soft. She exhibits no distension. There is no tenderness.  Genitourinary: Vagina normal and uterus normal. No vaginal discharge found.  External GU no lesions or rash noted. Cervix normal  limit in appearance, Pap smear obtained. No CMT. Uterus and adnexa normal limit and nontender.  Musculoskeletal: She exhibits no edema.  Lymphadenopathy:    She has no cervical adenopathy.  Neurological: She is alert and oriented to person, place, and time.  Skin: Skin is warm and dry. No rash noted.  Psychiatric: She has a normal mood and affect. Her behavior is normal.   breast exam: Dense tissue, no dominant masses. Axillae no adenopathy.        Assessment & Plan:  Well woman exam  Contraception - Plan: POCT urine pregnancy, medroxyPROGESTERone (DEPO-PROVERA) injection 150 mg  Pap smear for cervical cancer screening - Plan: Pap IG w/ reflex to HPV when ASC-U  Meds ordered this encounter  Medications  . medroxyPROGESTERone (DEPO-PROVERA) injection 150 mg    Sig:    Tdap given 2014 Encouraged healthy diet and regular activity. Next physical in one year.

## 2013-04-18 LAB — PAP IG W/ RFLX HPV ASCU

## 2013-07-01 ENCOUNTER — Ambulatory Visit (INDEPENDENT_AMBULATORY_CARE_PROVIDER_SITE_OTHER): Payer: 59 | Admitting: *Deleted

## 2013-07-01 DIAGNOSIS — IMO0001 Reserved for inherently not codable concepts without codable children: Secondary | ICD-10-CM

## 2013-07-01 DIAGNOSIS — Z309 Encounter for contraceptive management, unspecified: Secondary | ICD-10-CM

## 2013-07-01 MED ORDER — MEDROXYPROGESTERONE ACETATE 150 MG/ML IM SUSP
150.0000 mg | Freq: Once | INTRAMUSCULAR | Status: AC
  Administered 2013-07-01: 150 mg via INTRAMUSCULAR

## 2013-09-16 ENCOUNTER — Ambulatory Visit (INDEPENDENT_AMBULATORY_CARE_PROVIDER_SITE_OTHER): Payer: 59 | Admitting: *Deleted

## 2013-09-16 DIAGNOSIS — Z3049 Encounter for surveillance of other contraceptives: Secondary | ICD-10-CM

## 2013-09-16 DIAGNOSIS — Z3042 Encounter for surveillance of injectable contraceptive: Secondary | ICD-10-CM

## 2013-09-16 MED ORDER — MEDROXYPROGESTERONE ACETATE 150 MG/ML IM SUSP
150.0000 mg | Freq: Once | INTRAMUSCULAR | Status: AC
  Administered 2013-09-16: 150 mg via INTRAMUSCULAR

## 2013-10-12 ENCOUNTER — Ambulatory Visit: Payer: Self-pay | Admitting: Nurse Practitioner

## 2013-10-13 ENCOUNTER — Encounter: Payer: Self-pay | Admitting: Nurse Practitioner

## 2013-10-13 ENCOUNTER — Ambulatory Visit (INDEPENDENT_AMBULATORY_CARE_PROVIDER_SITE_OTHER): Payer: 59 | Admitting: Nurse Practitioner

## 2013-10-13 VITALS — BP 112/76 | Ht 64.0 in | Wt 164.0 lb

## 2013-10-13 DIAGNOSIS — F4322 Adjustment disorder with anxiety: Secondary | ICD-10-CM | POA: Insufficient documentation

## 2013-10-13 MED ORDER — CITALOPRAM HYDROBROMIDE 20 MG PO TABS: ORAL_TABLET | ORAL | Status: DC

## 2013-10-13 NOTE — Progress Notes (Signed)
Subjective:  Presents for complaints of increased stress and anxiety over the past month. Relates this to stress at work. Has been shortstaffed. Denies any postpartum depression. Energy level. Trouble going to sleep but no frequent awakenings. Emotional lability. No social isolation. Denies suicidal thoughts or ideation. Excellent support system.  Objective:   BP 112/76  Ht  (1.626 m)  Wt 164 lb (74.39 kg)  BMI 28.14 kg/m2  Breastfeeding? No NAD. Alert, oriented. Thoughts logical coherent and relevant. Dressed appropriately. Lungs clear. Heart regular rate rhythm.  Assessment: Adjustment disorder with anxious mood  Plan:  Meds ordered this encounter  Medications  . citalopram (CELEXA) 20 MG tablet    Sig: One po qhs    Dispense:  30 tablet    Refill:  0    Order Specific Question:  Supervising Provider    Answer:  Merlyn Albert [[id number]]   Start Celexa 20 mg half tab by mouth each bedtime for several days and if tolerated increase to one by mouth each bedtime. Cautioned about potential adverse effects. DC med and call if any problems. Otherwise recheck in one month.

## 2013-11-11 ENCOUNTER — Ambulatory Visit: Payer: 59 | Admitting: Nurse Practitioner

## 2013-11-24 ENCOUNTER — Ambulatory Visit (INDEPENDENT_AMBULATORY_CARE_PROVIDER_SITE_OTHER): Payer: 59 | Admitting: Nurse Practitioner

## 2013-11-24 ENCOUNTER — Encounter: Payer: Self-pay | Admitting: Nurse Practitioner

## 2013-11-24 VITALS — BP 108/76 | Ht 64.0 in | Wt 164.0 lb

## 2013-11-24 DIAGNOSIS — F4322 Adjustment disorder with anxiety: Secondary | ICD-10-CM

## 2013-11-24 MED ORDER — CITALOPRAM HYDROBROMIDE 20 MG PO TABS: ORAL_TABLET | ORAL | Status: DC

## 2013-11-24 NOTE — Progress Notes (Signed)
Subjective:  Presents for follow up. Taking celexa 20 mg 1/2 tab at bedtime. Working well. Sleeping well.   Objective:   BP 108/76  Ht 5\' 4"  (1.626 m)  Wt 164 lb (74.39 kg)  BMI 28.14 kg/m2 NAD. Alert, oriented. Lungs clear. Heart RRR.   Assessment:  Problem List Items Addressed This Visit     Other   Adjustment disorder with anxious mood - Primary     Plan:  Meds ordered this encounter  Medications  . citalopram (CELEXA) 20 MG tablet    Sig: One po qhs    Dispense:  30 tablet    Refill:  5    Order Specific Question:  Supervising Provider    Answer:  Riccardo DubinLUKING, WILLIAM S [[id number]]   May increase back up to one tab if needed over time.  Gets flu vaccine at work.  Return in about 6 months (around 05/26/2014).

## 2013-12-07 ENCOUNTER — Ambulatory Visit (INDEPENDENT_AMBULATORY_CARE_PROVIDER_SITE_OTHER): Payer: 59 | Admitting: *Deleted

## 2013-12-07 DIAGNOSIS — Z3042 Encounter for surveillance of injectable contraceptive: Secondary | ICD-10-CM

## 2013-12-07 MED ORDER — MEDROXYPROGESTERONE ACETATE 150 MG/ML IM SUSP
150.0000 mg | Freq: Once | INTRAMUSCULAR | Status: AC
  Administered 2013-12-07: 150 mg via INTRAMUSCULAR

## 2013-12-12 ENCOUNTER — Encounter: Payer: Self-pay | Admitting: Nurse Practitioner

## 2014-02-22 ENCOUNTER — Ambulatory Visit (INDEPENDENT_AMBULATORY_CARE_PROVIDER_SITE_OTHER): Payer: 59 | Admitting: *Deleted

## 2014-02-22 DIAGNOSIS — Z3042 Encounter for surveillance of injectable contraceptive: Secondary | ICD-10-CM

## 2014-02-22 MED ORDER — MEDROXYPROGESTERONE ACETATE 150 MG/ML IM SUSP
150.0000 mg | Freq: Once | INTRAMUSCULAR | Status: AC
  Administered 2014-02-22: 150 mg via INTRAMUSCULAR

## 2014-05-24 ENCOUNTER — Ambulatory Visit: Payer: 59 | Admitting: Nurse Practitioner

## 2014-05-25 ENCOUNTER — Encounter: Payer: Self-pay | Admitting: Nurse Practitioner

## 2014-05-25 ENCOUNTER — Ambulatory Visit (INDEPENDENT_AMBULATORY_CARE_PROVIDER_SITE_OTHER): Payer: 59 | Admitting: Nurse Practitioner

## 2014-05-25 VITALS — BP 112/70 | Ht 64.0 in | Wt 173.1 lb

## 2014-05-25 DIAGNOSIS — Z3049 Encounter for surveillance of other contraceptives: Secondary | ICD-10-CM | POA: Diagnosis not present

## 2014-05-25 DIAGNOSIS — Z308 Encounter for other contraceptive management: Secondary | ICD-10-CM | POA: Diagnosis not present

## 2014-05-25 DIAGNOSIS — Z3042 Encounter for surveillance of injectable contraceptive: Secondary | ICD-10-CM

## 2014-05-25 DIAGNOSIS — F4322 Adjustment disorder with anxiety: Secondary | ICD-10-CM | POA: Diagnosis not present

## 2014-05-25 DIAGNOSIS — R350 Frequency of micturition: Secondary | ICD-10-CM

## 2014-05-25 LAB — POCT URINALYSIS DIPSTICK
Blood, UA: 10
Spec Grav, UA: >=1.03
pH, UA: 5

## 2014-05-25 LAB — POCT UA - MICROSCOPIC ONLY
Bacteria, U Microscopic: NEGATIVE
RBC, urine, microscopic: 0
WBC, Ur, HPF, POC: 0

## 2014-05-25 MED ORDER — MEDROXYPROGESTERONE ACETATE 150 MG/ML IM SUSP
150.0000 mg | Freq: Once | INTRAMUSCULAR | Status: AC
  Administered 2014-05-25: 150 mg via INTRAMUSCULAR

## 2014-05-25 MED ORDER — FLUCONAZOLE 150 MG PO TABS: ORAL_TABLET | ORAL | Status: DC

## 2014-05-25 MED ORDER — CITALOPRAM HYDROBROMIDE 20 MG PO TABS
ORAL_TABLET | ORAL | Status: DC
Start: 2014-05-25 — End: 2014-10-09

## 2014-05-27 ENCOUNTER — Encounter: Payer: Self-pay | Admitting: Nurse Practitioner

## 2014-05-27 NOTE — Progress Notes (Signed)
Subjective:  Presents for recheck. Currently on Celexa 20 mg one each day. Increased stress at work. Would like to increase celexa slightly. Sleeping well. No bleeding on Depo Provera. Same sexual partner. Some urinary frequency x 3 d. No urgency or dysuria. No fever. Mild low back pain. No recent UTI. Some vaginal itching. No discharge. On Doxycyline for dental issue. Bowels nl.   Objective:   BP 112/70 mmHg  Ht 5\' 4"  (1.626 m)  Wt 173 lb 2 oz (78.529 kg)  BMI 29.70 kg/m2 NAD. Alert, oriented. Cheerful affect. Lungs clear. Heart RRR. No CVA tenderness. Abdomen soft, non tender.  Results for orders placed or performed in visit on 05/25/14  POCT urinalysis dipstick  Result Value Ref Range   Color, UA     Clarity, UA     Glucose, UA     Bilirubin, UA     Ketones, UA     Spec Grav, UA >=1.030    Blood, UA 10    pH, UA 5.0    Protein, UA     Urobilinogen, UA     Nitrite, UA     Leukocytes, UA large (3+)   POCT UA - Microscopic Only  Result Value Ref Range   WBC, Ur, HPF, POC 0    RBC, urine, microscopic 0    Bacteria, U Microscopic neg    Mucus, UA     Epithelial cells, urine per micros multiple    Crystals, Ur, HPF, POC     Casts, Ur, LPF, POC     Yeast, UA       Assessment:  Problem List Items Addressed This Visit      Other   Adjustment disorder with anxious mood    Other Visit Diagnoses    Encounter for management and injection of depo-Provera    -  Primary    Relevant Medications    medroxyPROGESTERone (DEPO-PROVERA) injection 150 mg (Completed)    Urinary frequency        Relevant Orders    POCT urinalysis dipstick (Completed)    POCT UA - Microscopic Only (Completed)      Plan:  Meds ordered this encounter  Medications  . medroxyPROGESTERone (DEPO-PROVERA) 150 MG/ML injection    Sig: Inject 150 mg into the muscle every 3 (three) months.  . medroxyPROGESTERone (DEPO-PROVERA) injection 150 mg    Sig:   . fluconazole (DIFLUCAN) 150 MG tablet    Sig: One po  qd prn yeast infection; may repeat in 3-4 days if needed    Dispense:  2 tablet    Refill:  0    Order Specific Question:  Supervising Provider    Answer:  Merlyn AlbertLUKING, WILLIAM S [[id number]]  . citalopram (CELEXA) 20 MG tablet    Sig: 1 1/2 tabs po qhs    Dispense:  135 tablet    Refill:  1    Order Specific Question:  Supervising Provider    Answer:  Merlyn AlbertLUKING, WILLIAM S [[id number]]   Increase celexa to 1 1/2 tabs. Call back if no improvement.  Return in about 6 months (around 11/24/2014).

## 2014-08-10 ENCOUNTER — Ambulatory Visit (INDEPENDENT_AMBULATORY_CARE_PROVIDER_SITE_OTHER): Payer: 59

## 2014-08-10 DIAGNOSIS — Z3049 Encounter for surveillance of other contraceptives: Secondary | ICD-10-CM

## 2014-08-10 DIAGNOSIS — Z3042 Encounter for surveillance of injectable contraceptive: Secondary | ICD-10-CM

## 2014-08-10 MED ORDER — MEDROXYPROGESTERONE ACETATE 150 MG/ML IM SUSP
150.0000 mg | Freq: Once | INTRAMUSCULAR | Status: AC
  Administered 2014-08-10: 150 mg via INTRAMUSCULAR

## 2014-10-09 ENCOUNTER — Other Ambulatory Visit: Payer: Self-pay | Admitting: Nurse Practitioner

## 2014-10-26 ENCOUNTER — Ambulatory Visit (INDEPENDENT_AMBULATORY_CARE_PROVIDER_SITE_OTHER): Payer: 59

## 2014-10-26 DIAGNOSIS — Z3049 Encounter for surveillance of other contraceptives: Secondary | ICD-10-CM | POA: Diagnosis not present

## 2014-10-26 DIAGNOSIS — Z3042 Encounter for surveillance of injectable contraceptive: Secondary | ICD-10-CM

## 2014-10-26 MED ORDER — MEDROXYPROGESTERONE ACETATE 150 MG/ML IM SUSP
150.0000 mg | Freq: Once | INTRAMUSCULAR | Status: AC
  Administered 2014-10-26: 150 mg via INTRAMUSCULAR

## 2014-11-24 ENCOUNTER — Encounter: Payer: Self-pay | Admitting: Nurse Practitioner

## 2014-11-24 ENCOUNTER — Ambulatory Visit (INDEPENDENT_AMBULATORY_CARE_PROVIDER_SITE_OTHER): Payer: 59 | Admitting: Nurse Practitioner

## 2014-11-24 VITALS — BP 130/76 | Ht 64.0 in | Wt 184.0 lb

## 2014-11-24 DIAGNOSIS — F4322 Adjustment disorder with anxiety: Secondary | ICD-10-CM

## 2014-11-24 DIAGNOSIS — R5383 Other fatigue: Secondary | ICD-10-CM | POA: Diagnosis not present

## 2014-11-24 MED ORDER — CITALOPRAM HYDROBROMIDE 20 MG PO TABS: ORAL_TABLET | ORAL | Status: DC

## 2014-11-24 NOTE — Progress Notes (Signed)
Subjective:  Presents for follow-up. Doing well on current dose of Celexa. Sleeping well. Emotionally doing much better. No menstrual cycle on Depo-Provera. Working and going to school, has a small child at home. Increased fatigue lately.  Objective:   BP 130/76 mmHg  Ht 5\' 4"  (1.626 m)  Wt 184 lb (83.462 kg)  BMI 31.57 kg/m2 NAD. Alert, oriented. Thyroid normal limit to palpation and nontender. Lungs clear. Heart regular rate rhythm.  Assessment:  Problem List Items Addressed This Visit      Other   Adjustment disorder with anxious mood - Primary    Other Visit Diagnoses    Other fatigue        Relevant Orders    CBC with Differential/Platelet    TSH    Vit D  25 hydroxy (rtn osteoporosis monitoring)      Plan:  Meds ordered this encounter  Medications  . citalopram (CELEXA) 20 MG tablet    Sig: TAKE 1&1/2 TABLETS BY MOUTH AT BEDTIME    Dispense:  135 tablet    Refill:  1    Order Specific Question:  Supervising Provider    Answer:  Merlyn AlbertLUKING, WILLIAM S [[id number]]   Continue Celexa as directed. Lab work pending. Discussed most likely cause of fatigue is related to stress and very busy lifestyle. Return in about 6 months (around 05/25/2015) for recheck. Call back sooner if any problems.

## 2014-11-25 LAB — CBC WITH DIFFERENTIAL/PLATELET
BASOS ABS: 0 10*3/uL (ref 0.0–0.2)
Basos: 0 %
EOS (ABSOLUTE): 0.2 10*3/uL (ref 0.0–0.4)
Eos: 3 %
Hematocrit: 38.6 % (ref 34.0–46.6)
Hemoglobin: 13.2 g/dL (ref 11.1–15.9)
IMMATURE GRANS (ABS): 0 10*3/uL (ref 0.0–0.1)
Immature Granulocytes: 0 %
LYMPHS: 35 %
Lymphocytes Absolute: 2 10*3/uL (ref 0.7–3.1)
MCH: 29.8 pg (ref 26.6–33.0)
MCHC: 34.2 g/dL (ref 31.5–35.7)
MCV: 87 fL (ref 79–97)
Monocytes Absolute: 0.3 10*3/uL (ref 0.1–0.9)
Monocytes: 5 %
Neutrophils Absolute: 3.3 10*3/uL (ref 1.4–7.0)
Neutrophils: 57 %
PLATELETS: 237 10*3/uL (ref 150–379)
RBC: 4.43 x10E6/uL (ref 3.77–5.28)
RDW: 12.5 % (ref 12.3–15.4)
WBC: 5.7 10*3/uL (ref 3.4–10.8)

## 2014-11-25 LAB — VITAMIN D 25 HYDROXY (VIT D DEFICIENCY, FRACTURES): Vit D, 25-Hydroxy: 24 ng/mL — ABNORMAL LOW (ref 30.0–100.0)

## 2014-11-25 LAB — TSH: TSH: 2.14 u[IU]/mL (ref 0.450–4.500)

## 2015-01-11 ENCOUNTER — Ambulatory Visit: Payer: 59

## 2015-01-12 ENCOUNTER — Ambulatory Visit (INDEPENDENT_AMBULATORY_CARE_PROVIDER_SITE_OTHER): Payer: 59 | Admitting: Nurse Practitioner

## 2015-01-12 ENCOUNTER — Encounter: Payer: Self-pay | Admitting: Nurse Practitioner

## 2015-01-12 VITALS — BP 110/74 | Ht 64.0 in | Wt 188.2 lb

## 2015-01-12 DIAGNOSIS — N760 Acute vaginitis: Secondary | ICD-10-CM | POA: Diagnosis not present

## 2015-01-12 DIAGNOSIS — Z113 Encounter for screening for infections with a predominantly sexual mode of transmission: Secondary | ICD-10-CM | POA: Diagnosis not present

## 2015-01-12 DIAGNOSIS — Z3049 Encounter for surveillance of other contraceptives: Secondary | ICD-10-CM | POA: Diagnosis not present

## 2015-01-12 DIAGNOSIS — Z3042 Encounter for surveillance of injectable contraceptive: Secondary | ICD-10-CM

## 2015-01-12 LAB — POCT WET PREP WITH KOH
KOH Prep POC: NEGATIVE
RBC Wet Prep HPF POC: NEGATIVE
TRICHOMONAS UA: NEGATIVE
pH, Wet Prep: 4.5

## 2015-01-12 MED ORDER — MEDROXYPROGESTERONE ACETATE 150 MG/ML IM SUSP
150.0000 mg | Freq: Once | INTRAMUSCULAR | Status: AC
  Administered 2015-01-12: 150 mg via INTRAMUSCULAR

## 2015-01-13 LAB — GC/CHLAMYDIA PROBE AMP
Chlamydia trachomatis, NAA: NEGATIVE
Neisseria gonorrhoeae by PCR: NEGATIVE

## 2015-01-13 LAB — RPR: RPR Ser Ql: NONREACTIVE

## 2015-01-13 LAB — HIV ANTIBODY (ROUTINE TESTING W REFLEX): HIV Screen 4th Generation wRfx: NONREACTIVE

## 2015-01-13 LAB — PLEASE NOTE

## 2015-01-13 LAB — HEPATITIS C ANTIBODY

## 2015-01-14 ENCOUNTER — Encounter: Payer: Self-pay | Admitting: Nurse Practitioner

## 2015-01-14 NOTE — Progress Notes (Signed)
Subjective:  Presents requesting STD testing. Recently found our her partner cheated on her. No vaginal discharge, fever or pelvic pain. No BTB on Depo Provera. Is no longer with this partner, no new partners. 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   Objective:   BP 110/74 mmHg  Ht 5\' 4"  (1.626 m)  Wt 188 lb 4 oz (85.39 kg)  BMI 32.30 kg/m2 NAD. Alert, oriented. Lungs clear. Heart RRR. Abdomen soft, non tender. External GU: no rashes or lesions. Vagina: minimal white discharge. No CMT. Bimanual exam: no tenderness or obvious masses. Results for orders placed or performed in visit on 01/12/15                                                              POCT Wet Prep with KOH  Result Value Ref Range   Trichomonas, UA Negative    Clue Cells Wet Prep HPF POC rare    Epithelial Wet Prep HPF POC Moderate Few, Moderate, Many, Too numerous to count   Yeast Wet Prep HPF POC rare    Bacteria Wet Prep HPF POC Few None, Few, Too numerous to count   RBC Wet Prep HPF POC neg    WBC Wet Prep HPF POC rare    KOH Prep POC Negative    pH, Wet Prep 4.5      Assessment: Vaginitis - Plan: POCT Wet Prep with KOH  Encounter for management and injection of depo-Provera - Plan: medroxyPROGESTERone (DEPO-PROVERA) injection 150 mg  Screening for STD (sexually transmitted disease) - Plan: HIV antibody (with reflex), Hepatitis C Antibody, RPR, GC/Chlamydia Probe Amp  Plan:  Meds ordered this encounter  Medications  . medroxyPROGESTERone (DEPO-PROVERA) injection 150 mg    Sig:    Discussed safe sex issues.  Return if symptoms worsen or fail to improve.

## 2015-03-30 ENCOUNTER — Ambulatory Visit (INDEPENDENT_AMBULATORY_CARE_PROVIDER_SITE_OTHER): Payer: 59 | Admitting: Family Medicine

## 2015-03-30 ENCOUNTER — Ambulatory Visit: Payer: 59 | Admitting: Family Medicine

## 2015-03-30 ENCOUNTER — Encounter: Payer: Self-pay | Admitting: Family Medicine

## 2015-03-30 ENCOUNTER — Ambulatory Visit: Payer: 59

## 2015-03-30 VITALS — BP 122/88 | Temp 98.3°F | Ht 64.0 in | Wt 192.0 lb

## 2015-03-30 DIAGNOSIS — B9689 Other specified bacterial agents as the cause of diseases classified elsewhere: Secondary | ICD-10-CM

## 2015-03-30 DIAGNOSIS — J019 Acute sinusitis, unspecified: Secondary | ICD-10-CM

## 2015-03-30 DIAGNOSIS — J029 Acute pharyngitis, unspecified: Secondary | ICD-10-CM | POA: Diagnosis not present

## 2015-03-30 LAB — POCT RAPID STREP A (OFFICE): Rapid Strep A Screen: NEGATIVE

## 2015-03-30 MED ORDER — AMOXICILLIN 500 MG PO TABS: 500.0000 mg | ORAL_TABLET | Freq: Three times a day (TID) | ORAL | Status: DC

## 2015-03-30 NOTE — Progress Notes (Signed)
   Subjective:    Patient ID: Mary [name], female    DOB: Dec 17, 1985, 30 y.o.   MRN: [id number]  Sore Throat  This is a new problem. The current episode started yesterday. Neither side of throat is experiencing more pain than the other. Associated symptoms include coughing and ear pain.  PMH benign  Patient states no other concerns this visit  Review of Systems  HENT: Positive for ear pain.   Respiratory: Positive for cough.   patient complains a sore throat she also complains a cough she denies high fever chills sweats relates some body aches also some fatigue     Objective:   Physical Exam  Mild sinus tenderness throat normal neck supple lungs clear heart regular Rapid strep negative     Assessment & Plan:  Viral syndrome Sore throat due to viruses Secondary rhinosinusitis Antibiotics prescribed warning signs discussed.

## 2015-03-31 LAB — STREP A DNA PROBE: Strep Gp A Direct, DNA Probe: NEGATIVE

## 2015-04-04 ENCOUNTER — Ambulatory Visit (INDEPENDENT_AMBULATORY_CARE_PROVIDER_SITE_OTHER): Payer: 59 | Admitting: *Deleted

## 2015-04-04 DIAGNOSIS — Z309 Encounter for contraceptive management, unspecified: Secondary | ICD-10-CM

## 2015-04-04 MED ORDER — MEDROXYPROGESTERONE ACETATE 150 MG/ML IM SUSP
150.0000 mg | Freq: Once | INTRAMUSCULAR | Status: AC
  Administered 2015-04-04: 150 mg via INTRAMUSCULAR

## 2015-04-04 NOTE — Patient Instructions (Signed)
Next depo provera due may 10 - may 24

## 2015-05-25 ENCOUNTER — Ambulatory Visit: Payer: 59 | Admitting: Nurse Practitioner

## 2015-06-06 ENCOUNTER — Ambulatory Visit (INDEPENDENT_AMBULATORY_CARE_PROVIDER_SITE_OTHER): Payer: 59 | Admitting: Nurse Practitioner

## 2015-06-06 ENCOUNTER — Encounter: Payer: Self-pay | Admitting: Nurse Practitioner

## 2015-06-06 VITALS — BP 122/76 | Temp 98.7°F | Ht 64.0 in | Wt 192.0 lb

## 2015-06-06 DIAGNOSIS — R300 Dysuria: Secondary | ICD-10-CM

## 2015-06-06 DIAGNOSIS — Z113 Encounter for screening for infections with a predominantly sexual mode of transmission: Secondary | ICD-10-CM

## 2015-06-06 LAB — POCT URINALYSIS DIPSTICK
Blood, UA: 250
Spec Grav, UA: >=1.03
UROBILINOGEN UA: 4
pH, UA: 5

## 2015-06-06 LAB — POCT UA - MICROSCOPIC ONLY

## 2015-06-06 LAB — POCT URINE PREGNANCY: PREG TEST UR: NEGATIVE

## 2015-06-06 MED ORDER — CEFDINIR 300 MG PO CAPS: 300.0000 mg | ORAL_CAPSULE | Freq: Two times a day (BID) | ORAL | Status: DC

## 2015-06-06 NOTE — Progress Notes (Signed)
Subjective:  Presents for complaints of pain and burning with urination that began 2 days ago. Slight odor. Had a fever 3 days ago, low-grade, resolved. Some urgency and frequency. Mild left flank pain. No back pain. Mild left pelvic area discomfort. No history of recent UTI. No vaginal discharge. Current sexual partner for 2 months.  Objective:   BP 122/76 mmHg  Temp(Src) 98.7 F (37.1 C) (Oral)  Ht 5\' 4"  (1.626 m)  Wt 192 lb (87.091 kg)  BMI 32.94 kg/m2 NAD. Alert, oriented. Lungs clear. No CVA tenderness. Heart regular rate rhythm. Mild left flank tenderness. Abdomen soft nondistended with minimal pelvic area tenderness. Results for orders placed or performed in visit on 06/06/15  POCT urinalysis dipstick  Result Value Ref Range   Color, UA     Clarity, UA     Glucose, UA     Bilirubin, UA ++    Ketones, UA     Spec Grav, UA >=1.030    Blood, UA 250    pH, UA 5.0    Protein, UA     Urobilinogen, UA 4.0    Nitrite, UA     Leukocytes, UA 4+ (A) Negative  POCT urine pregnancy  Result Value Ref Range   Preg Test, Ur Negative Negative  POCT UA - Microscopic Only  Result Value Ref Range   WBC, Ur, HPF, POC occas    RBC, urine, microscopic rare    Bacteria, U Microscopic occas    Mucus, UA     Epithelial cells, urine per micros multiple    Crystals, Ur, HPF, POC     Casts, Ur, LPF, POC     Yeast, UA      Urine not sent for culture due to poor sample.  Assessment: Dysuria - Plan: POCT urinalysis dipstick, POCT urine pregnancy, GC/Chlamydia Probe Amp, POCT UA - Microscopic Only  Screen for STD (sexually transmitted disease) - Plan: GC/Chlamydia Probe Amp  Plan:  Meds ordered this encounter  Medications  . cefdinir (OMNICEF) 300 MG capsule    Sig: Take 1 capsule (300 mg total) by mouth 2 (two) times daily.    Dispense:  14 capsule    Refill:  0    Order Specific Question:  Supervising Provider    Answer:  Merlyn AlbertLUKING, WILLIAM S [[id number]]   AZO as directed for 48 hours then  discontinue. Call back if symptoms worsen or persist. Warning signs reviewed. Also discussed safe sex issues.

## 2015-06-07 LAB — GC/CHLAMYDIA PROBE AMP
CHLAMYDIA, DNA PROBE: NEGATIVE
NEISSERIA GONORRHOEAE BY PCR: NEGATIVE

## 2015-06-13 ENCOUNTER — Ambulatory Visit: Payer: 59 | Admitting: Nurse Practitioner

## 2015-06-20 ENCOUNTER — Ambulatory Visit: Payer: 59

## 2016-01-16 DIAGNOSIS — H5213 Myopia, bilateral: Secondary | ICD-10-CM | POA: Diagnosis not present

## 2016-06-26 ENCOUNTER — Encounter: Payer: Self-pay | Admitting: Nurse Practitioner

## 2016-06-26 ENCOUNTER — Ambulatory Visit (INDEPENDENT_AMBULATORY_CARE_PROVIDER_SITE_OTHER): Payer: 59 | Admitting: Nurse Practitioner

## 2016-06-26 VITALS — BP 108/84 | Temp 98.3°F | Ht 62.75 in | Wt 206.6 lb

## 2016-06-26 DIAGNOSIS — Z124 Encounter for screening for malignant neoplasm of cervix: Secondary | ICD-10-CM | POA: Diagnosis not present

## 2016-06-26 DIAGNOSIS — Z1151 Encounter for screening for human papillomavirus (HPV): Secondary | ICD-10-CM | POA: Diagnosis not present

## 2016-06-26 DIAGNOSIS — Z0000 Encounter for general adult medical examination without abnormal findings: Secondary | ICD-10-CM

## 2016-06-28 ENCOUNTER — Encounter: Payer: Self-pay | Admitting: Nurse Practitioner

## 2016-06-28 NOTE — Progress Notes (Signed)
   Subjective:    Patient ID: Mary [name], female    DOB: 05/26/1985, 31 y.o.   MRN: [id number]  HPI presents for her wellness exam. Off birth control. Plans to have another child. Last menses 4/20. Usually regular with normal flow. Same sexual partner. Walking for activity. Regular vision and dental exams.     Review of Systems  Constitutional: Negative for activity change, appetite change and fatigue.  HENT: Negative for dental problem, ear pain, sinus pressure and sore throat.   Respiratory: Negative for cough, chest tightness, shortness of breath and wheezing.   Cardiovascular: Negative for chest pain.  Gastrointestinal: Negative for abdominal distention, abdominal pain, constipation, diarrhea, nausea and vomiting.  Genitourinary: Negative for difficulty urinating, dysuria, enuresis, frequency, genital sores, menstrual problem, pelvic pain, urgency and vaginal discharge.       Objective:   Physical Exam  Constitutional: She is oriented to person, place, and time. She appears well-developed. No distress.  HENT:  Right Ear: External ear normal.  Left Ear: External ear normal.  Mouth/Throat: Oropharynx is clear and moist.  Neck: Normal range of motion. Neck supple. No tracheal deviation present. No thyromegaly present.  Cardiovascular: Normal rate, regular rhythm and normal heart sounds.  Exam reveals no gallop.   No murmur heard. Pulmonary/Chest: Effort normal and breath sounds normal.  Abdominal: Soft. She exhibits no distension. There is no tenderness.  Genitourinary: Vagina normal and uterus normal. No vaginal discharge found.  Genitourinary Comments: External GU: no rashes or lesions. Vagina: small amount of white mucoid discharge. Cervix normal in appearance. No CMT. Bimanual exam: no tenderness or obvious masses.   Musculoskeletal: She exhibits no edema.  Lymphadenopathy:    She has no cervical adenopathy.  Neurological: She is alert and oriented to person, place, and  time.  Skin: Skin is warm and dry. No rash noted.  Psychiatric: She has a normal mood and affect. Her behavior is normal.  Vitals reviewed. Breast exam: mild cystic areas; no masses; areas of dense tissue. Axillae no adenopathy.         Assessment & Plan:  Routine general medical examination at a health care facility - Plan: Pap IG and HPV (high risk) DNA detection  Screening for cervical cancer - Plan: Pap IG and HPV (high risk) DNA detection  Screening for HPV (human papillomavirus) - Plan: Pap IG and HPV (high risk) DNA detection  Encouraged prenatal vitamin, healthy diet, activity and adequate rest.  Return in about 1 year (around 06/26/2017) for physical.

## 2016-07-01 LAB — PAP IG AND HPV HIGH-RISK
HPV, high-risk: POSITIVE — AB
PAP SMEAR COMMENT: 0

## 2016-07-04 ENCOUNTER — Encounter: Payer: Self-pay | Admitting: Nurse Practitioner

## 2016-07-04 DIAGNOSIS — R87810 Cervical high risk human papillomavirus (HPV) DNA test positive: Secondary | ICD-10-CM | POA: Insufficient documentation

## 2017-01-13 ENCOUNTER — Ambulatory Visit: Payer: 59 | Admitting: Adult Health

## 2017-01-13 ENCOUNTER — Encounter: Payer: Self-pay | Admitting: Adult Health

## 2017-01-13 VITALS — BP 120/90 | HR 98 | Ht 64.0 in | Wt 216.0 lb

## 2017-01-13 DIAGNOSIS — N926 Irregular menstruation, unspecified: Secondary | ICD-10-CM

## 2017-01-13 DIAGNOSIS — R110 Nausea: Secondary | ICD-10-CM | POA: Diagnosis not present

## 2017-01-13 DIAGNOSIS — Z3A01 Less than 8 weeks gestation of pregnancy: Secondary | ICD-10-CM

## 2017-01-13 DIAGNOSIS — O3680X0 Pregnancy with inconclusive fetal viability, not applicable or unspecified: Secondary | ICD-10-CM

## 2017-01-13 DIAGNOSIS — Z3201 Encounter for pregnancy test, result positive: Secondary | ICD-10-CM

## 2017-01-13 LAB — POCT URINE PREGNANCY: PREG TEST UR: POSITIVE — AB

## 2017-01-13 NOTE — Progress Notes (Addendum)
Subjective:     Patient ID: Mary [name], female   DOB: 10/18/1985, 31 y.o.   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 She has 29 year old daughter.  PCP is Lubertha SouthSteve Luking.  Review of Systems +missed period, +HPT  +nasuea, no vomiting or cramping or bleeding  Reviewed past medical,surgical, social and family history. Reviewed medications and allergies.     Objective:   Physical Exam BP 120/90 (BP Location: Left Arm, Patient Position: Sitting, Cuff Size: Large)   Pulse 98   Ht 5\' 4"  (1.626 m)   Wt 216 lb (98 kg)   LMP 12/02/2016   BMI 37.08 kg/m UPT +, about 6 weeks by LMP with EDD 09/09/17.Skin warm and dry. Neck: mid line trachea, normal thyroid, good ROM, no lymphadenopathy noted. Lungs: clear to ausculation bilaterally. Cardiovascular: regular rate and rhythm.Abdomen is soft and non tender. PHQ 9 score 1.     Assessment:     1. Pregnancy test positive   2. Less than [redacted] weeks gestation of pregnancy   3. Encounter to determine fetal viability of pregnancy, single or unspecified fetus       Plan:     Continue PNV Gummies 2 daily Return in 1 week for dating US Eat often Review handouts on first trimester and by [facility]

## 2017-01-13 NOTE — Patient Instructions (Signed)
First Trimester of Pregnancy The first trimester of pregnancy is from week 1 until the end of week 13 (months 1 through 3). A week after a sperm fertilizes an egg, the egg will implant on the wall of the uterus. This embryo will begin to develop into a baby. Genes from you and your partner will form the baby. The female genes will determine whether the baby will be a boy or a girl. At 6-8 weeks, the eyes and face will be formed, and the heartbeat can be seen on ultrasound. At the end of 12 weeks, all the baby's organs will be formed. Now that you are pregnant, you will want to do everything you can to have a healthy baby. Two of the most important things are to get good prenatal care and to follow your health care provider's instructions. Prenatal care is all the medical care you receive before the baby's birth. This care will help prevent, find, and treat any problems during the pregnancy and childbirth. Body changes during your first trimester Your body goes through many changes during pregnancy. The changes vary from woman to woman.  You may gain or lose a couple of pounds at first.  You may feel sick to your stomach (nauseous) and you may throw up (vomit). If the vomiting is uncontrollable, call your health care provider.  You may tire easily.  You may develop headaches that can be relieved by medicines. All medicines should be approved by your health care provider.  You may urinate more often. Painful urination may mean you have a bladder infection.  You may develop heartburn as a result of your pregnancy.  You may develop constipation because certain hormones are causing the muscles that push stool through your intestines to slow down.  You may develop hemorrhoids or swollen veins (varicose veins).  Your breasts may begin to grow larger and become tender. Your nipples may stick out more, and the tissue that surrounds them (areola) may become darker.  Your gums may bleed and may be  sensitive to brushing and flossing.  Dark spots or blotches (chloasma, mask of pregnancy) may develop on your face. This will likely fade after the baby is born.  Your menstrual periods will stop.  You may have a loss of appetite.  You may develop cravings for certain kinds of food.  You may have changes in your emotions from day to day, such as being excited to be pregnant or being concerned that something may go wrong with the pregnancy and baby.  You may have more vivid and strange dreams.  You may have changes in your hair. These can include thickening of your hair, rapid growth, and changes in texture. Some women also have hair loss during or after pregnancy, or hair that feels dry or thin. Your hair will most likely return to normal after your baby is born.  What to expect at prenatal visits During a routine prenatal visit:  You will be weighed to make sure you and the baby are growing normally.  Your blood pressure will be taken.  Your abdomen will be measured to track your baby's growth.  The fetal heartbeat will be listened to between weeks 10 and 14 of your pregnancy.  Test results from any previous visits will be discussed.  Your health care provider may ask you:  How you are feeling.  If you are feeling the baby move.  If you have had any abnormal symptoms, such as leaking fluid, bleeding, severe headaches,   or abdominal cramping.  If you are using any tobacco products, including cigarettes, chewing tobacco, and electronic cigarettes.  If you have any questions.  Other tests that may be performed during your first trimester include:  Blood tests to find your blood type and to check for the presence of any previous infections. The tests will also be used to check for low iron levels (anemia) and protein on red blood cells (Rh antibodies). Depending on your risk factors, or if you previously had diabetes during pregnancy, you may have tests to check for high blood  sugar that affects pregnant women (gestational diabetes).  Urine tests to check for infections, diabetes, or protein in the urine.  An ultrasound to confirm the proper growth and development of the baby.  Fetal screens for spinal cord problems (spina bifida) and Down syndrome.  HIV (human immunodeficiency virus) testing. Routine prenatal testing includes screening for HIV, unless you choose not to have this test.  You may need other tests to make sure you and the baby are doing well.  Follow these instructions at home: Medicines  Follow your health care provider's instructions regarding medicine use. Specific medicines may be either safe or unsafe to take during pregnancy.  Take a prenatal vitamin that contains at least 600 micrograms (mcg) of folic acid.  If you develop constipation, try taking a stool softener if your health care provider approves. Eating and drinking  Eat a balanced diet that includes fresh fruits and vegetables, whole grains, good sources of protein such as meat, eggs, or tofu, and low-fat dairy. Your health care provider will help you determine the amount of weight gain that is right for you.  Avoid raw meat and uncooked cheese. These carry germs that can cause birth defects in the baby.  Eating four or five small meals rather than three large meals a day may help relieve nausea and vomiting. If you start to feel nauseous, eating a few soda crackers can be helpful. Drinking liquids between meals, instead of during meals, also seems to help ease nausea and vomiting.  Limit foods that are high in fat and processed sugars, such as fried and sweet foods.  To prevent constipation: ? Eat foods that are high in fiber, such as fresh fruits and vegetables, whole grains, and beans. ? Drink enough fluid to keep your urine clear or pale yellow. Activity  Exercise only as directed by your health care provider. Most women can continue their usual exercise routine during  pregnancy. Try to exercise for 30 minutes at least 5 days a week. Exercising will help you: ? Control your weight. ? Stay in shape. ? Be prepared for labor and delivery.  Experiencing pain or cramping in the lower abdomen or lower back is a good sign that you should stop exercising. Check with your health care provider before continuing with normal exercises.  Try to avoid standing for long periods of time. Move your legs often if you must stand in one place for a long time.  Avoid heavy lifting.  Wear low-heeled shoes and practice good posture.  You may continue to have sex unless your health care provider tells you not to. Relieving pain and discomfort  Wear a good support bra to relieve breast tenderness.  Take warm sitz baths to soothe any pain or discomfort caused by hemorrhoids. Use hemorrhoid cream if your health care provider approves.  Rest with your legs elevated if you have leg cramps or low back pain.  If you develop   varicose veins in your legs, wear support hose. Elevate your feet for 15 minutes, 3-4 times a day. Limit salt in your diet. Prenatal care  Schedule your prenatal visits by the twelfth week of pregnancy. They are usually scheduled monthly at first, then more often in the last 2 months before delivery.  Write down your questions. Take them to your prenatal visits.  Keep all your prenatal visits as told by your health care provider. This is important. Safety  Wear your seat belt at all times when driving.  Make a list of emergency phone numbers, including numbers for family, friends, the hospital, and police and fire departments. General instructions  Ask your health care provider for a referral to a local prenatal education class. Begin classes no later than the beginning of month 6 of your pregnancy.  Ask for help if you have counseling or nutritional needs during pregnancy. Your health care provider can offer advice or refer you to specialists for help  with various needs.  Do not use hot tubs, steam rooms, or saunas.  Do not douche or use tampons or scented sanitary pads.  Do not cross your legs for long periods of time.  Avoid cat litter boxes and soil used by cats. These carry germs that can cause birth defects in the baby and possibly loss of the fetus by miscarriage or stillbirth.  Avoid all smoking, herbs, alcohol, and medicines not prescribed by your health care provider. Chemicals in these products affect the formation and growth of the baby.  Do not use any products that contain nicotine or tobacco, such as cigarettes and e-cigarettes. If you need help quitting, ask your health care provider. You may receive counseling support and other resources to help you quit.  Schedule a dentist appointment. At home, brush your teeth with a soft toothbrush and be gentle when you floss. Contact a health care provider if:  You have dizziness.  You have mild pelvic cramps, pelvic pressure, or nagging pain in the abdominal area.  You have persistent nausea, vomiting, or diarrhea.  You have a bad smelling vaginal discharge.  You have pain when you urinate.  You notice increased swelling in your face, hands, legs, or ankles.  You are exposed to fifth disease or chickenpox.  You are exposed to German measles (rubella) and have never had it. Get help right away if:  You have a fever.  You are leaking fluid from your vagina.  You have spotting or bleeding from your vagina.  You have severe abdominal cramping or pain.  You have rapid weight gain or loss.  You vomit blood or material that looks like coffee grounds.  You develop a severe headache.  You have shortness of breath.  You have any kind of trauma, such as from a fall or a car accident. Summary  The first trimester of pregnancy is from week 1 until the end of week 13 (months 1 through 3).  Your body goes through many changes during pregnancy. The changes vary from  woman to woman.  You will have routine prenatal visits. During those visits, your health care provider will examine you, discuss any test results you may have, and talk with you about how you are feeling. This information is not intended to replace advice given to you by your health care provider. Make sure you discuss any questions you have with your health care provider. Document Released: 01/21/2001 Document Revised: 01/09/2016 Document Reviewed: 01/09/2016 Elsevier Interactive Patient Education  2017 Elsevier   Inc.  

## 2017-01-20 ENCOUNTER — Ambulatory Visit (INDEPENDENT_AMBULATORY_CARE_PROVIDER_SITE_OTHER): Payer: 59

## 2017-01-20 DIAGNOSIS — Z3A01 Less than 8 weeks gestation of pregnancy: Secondary | ICD-10-CM

## 2017-01-20 DIAGNOSIS — O3680X0 Pregnancy with inconclusive fetal viability, not applicable or unspecified: Secondary | ICD-10-CM | POA: Diagnosis not present

## 2017-01-20 NOTE — Progress Notes (Signed)
US: 6 wks GS w/ys,normal ovaries bilat,GS 12.2 mm,no fetal pole visualized,pt will come back in 10 days for f/u ultrasound

## 2017-01-29 ENCOUNTER — Other Ambulatory Visit: Payer: Self-pay | Admitting: Obstetrics and Gynecology

## 2017-01-29 DIAGNOSIS — O3680X0 Pregnancy with inconclusive fetal viability, not applicable or unspecified: Secondary | ICD-10-CM

## 2017-01-30 ENCOUNTER — Other Ambulatory Visit: Payer: 59

## 2017-01-30 ENCOUNTER — Other Ambulatory Visit: Payer: Self-pay | Admitting: Obstetrics and Gynecology

## 2017-01-30 ENCOUNTER — Ambulatory Visit (INDEPENDENT_AMBULATORY_CARE_PROVIDER_SITE_OTHER): Payer: 59

## 2017-01-30 DIAGNOSIS — O3680X0 Pregnancy with inconclusive fetal viability, not applicable or unspecified: Secondary | ICD-10-CM | POA: Diagnosis not present

## 2017-01-30 DIAGNOSIS — Z3A01 Less than 8 weeks gestation of pregnancy: Secondary | ICD-10-CM | POA: Diagnosis not present

## 2017-01-30 NOTE — Progress Notes (Signed)
F/U US TA/TV: 6 + 4 wks GS w/ys,GS 1.77 mm,no fetal pole visualized,normal ovaries bilat,labs w/a f/u ultrasound in two weeks per Victorino DikeJennifer

## 2017-01-31 LAB — BETA HCG QUANT (REF LAB): hCG Quant: 34245 m[IU]/mL

## 2017-01-31 LAB — PROGESTERONE: PROGESTERONE: 7.4 ng/mL

## 2017-02-05 ENCOUNTER — Telehealth: Payer: Self-pay | Admitting: Obstetrics & Gynecology

## 2017-02-05 NOTE — Telephone Encounter (Signed)
Informed patient that per Victorino DikeJennifer U/S showed blighted ovum and did not look like this would be a viable pregnancy. Informed she could keep next scheduled u/s if she wanted or could cancel. Pt stated she would like to keep it for now.

## 2017-02-12 ENCOUNTER — Other Ambulatory Visit: Payer: Self-pay | Admitting: Adult Health

## 2017-02-12 DIAGNOSIS — O3680X0 Pregnancy with inconclusive fetal viability, not applicable or unspecified: Secondary | ICD-10-CM

## 2017-02-13 ENCOUNTER — Ambulatory Visit (INDEPENDENT_AMBULATORY_CARE_PROVIDER_SITE_OTHER): Payer: 59 | Admitting: Adult Health

## 2017-02-13 ENCOUNTER — Encounter: Payer: Self-pay | Admitting: Adult Health

## 2017-02-13 ENCOUNTER — Ambulatory Visit (INDEPENDENT_AMBULATORY_CARE_PROVIDER_SITE_OTHER): Payer: 59

## 2017-02-13 ENCOUNTER — Other Ambulatory Visit: Payer: Self-pay | Admitting: Adult Health

## 2017-02-13 VITALS — BP 142/80 | HR 78 | Ht 64.0 in | Wt 215.5 lb

## 2017-02-13 DIAGNOSIS — O3680X0 Pregnancy with inconclusive fetal viability, not applicable or unspecified: Secondary | ICD-10-CM

## 2017-02-13 DIAGNOSIS — O039 Complete or unspecified spontaneous abortion without complication: Secondary | ICD-10-CM

## 2017-02-13 DIAGNOSIS — Z3A10 10 weeks gestation of pregnancy: Secondary | ICD-10-CM | POA: Diagnosis not present

## 2017-02-13 MED ORDER — IBUPROFEN 800 MG PO TABS: 800.0000 mg | ORAL_TABLET | Freq: Three times a day (TID) | ORAL | 1 refills | Status: DC | PRN

## 2017-02-13 MED ORDER — MISOPROSTOL 200 MCG PO TABS: ORAL_TABLET | ORAL | 0 refills | Status: DC

## 2017-02-13 NOTE — Progress Notes (Signed)
US 6+4 wks GS w/ys,NO fetal pole,GS 17.3 mm,normal ovaries bilat,Jennifer discussed results w/pt

## 2017-02-13 NOTE — Progress Notes (Signed)
Subjective:     Patient ID: Mary [name], female   DOB: 02/09/1986, 32 y.o.   MRN: [id number]  HPI Mary [name] is a 32 year old white female back in for F/U US she had US 12/21 that showed GS with YS and no fetal pole, and before that 12/11 GS with YS no fetal pole, she was aware that Dr Emelda FearFerguson felt this to be blighted ovum.    Review of Systems  No bleeding  Reviewed past medical,surgical, social and family history. Reviewed medications and allergies.     Objective:   Physical Exam BP (!) 142/80 (BP Location: Left Arm, Patient Position: Sitting, Cuff Size: Large)   Pulse 78   Ht 5\' 4"  (1.626 m)   Wt 215 lb 8 oz (97.8 kg)   LMP 12/02/2016   BMI 36.99 kg/m blood type AB+. Discussed US has GS with YS no fetal pole, normal ovaries. Discussed expectant management or using Cytotec and she prefers to proceed with Cytotec.     Assessment:     Miscarriage, blighted ovum    Plan:     Meds ordered this encounter  Medications  . misoprostol (CYTOTEC) 200 MCG tablet    Sig: Put 4 in vagina 1/13 at bedtime    Dispense:  4 tablet    Refill:  0    Order Specific Question:   Supervising Provider    Answer:   Despina HiddenEURE, LUTHER H [[id number]]  . ibuprofen (ADVIL,MOTRIN) 800 MG tablet    Sig: Take 1 tablet (800 mg total) by mouth every 8 (eight) hours as needed.    Dispense:  60 tablet    Refill:  1    Order Specific Question:   Supervising Provider    Answer:   Lazaro ArmsEURE, LUTHER H [[id number]]  F/U 1/22 with me

## 2017-03-03 ENCOUNTER — Ambulatory Visit (INDEPENDENT_AMBULATORY_CARE_PROVIDER_SITE_OTHER): Payer: 59 | Admitting: Adult Health

## 2017-03-03 ENCOUNTER — Encounter: Payer: Self-pay | Admitting: Adult Health

## 2017-03-03 VITALS — BP 102/58 | HR 88 | Ht 64.0 in | Wt 220.0 lb

## 2017-03-03 DIAGNOSIS — O039 Complete or unspecified spontaneous abortion without complication: Secondary | ICD-10-CM | POA: Diagnosis not present

## 2017-03-03 NOTE — Progress Notes (Signed)
Subjective:     Patient ID: Sherlon Handingmanda C Norman, female   DOB: 05/14/1985, 32 y.o.   MRN: [id number]  HPI Marchelle Folksmanda is a 32 year old white female, in for follow up on miscarriage,she started bleeding 1/11/ 19 and passed tissue and did not take Cytotec, still bleeding some. Blood type AB+.   Review of Systems +bleeding Reviewed past medical,surgical, social and family history. Reviewed medications and allergies.     Objective:   Physical Exam BP (!) 102/58 (BP Location: Left Arm, Patient Position: Sitting, Cuff Size: Large)   Pulse 88   Ht 5\' 4"  (1.626 m)   Wt 220 lb (99.8 kg)   LMP 12/02/2016   Breastfeeding? Unknown   BMI 37.76 kg/m    Talk only, [facility] and [facility] till <5, then can start trying to get pregnant again, so continue PNV.  Assessment:     1. Miscarriage       Plan:     Check QHCG F/U prn

## 2017-03-04 ENCOUNTER — Other Ambulatory Visit: Payer: Self-pay | Admitting: Adult Health

## 2017-03-04 DIAGNOSIS — O039 Complete or unspecified spontaneous abortion without complication: Secondary | ICD-10-CM

## 2017-03-04 LAB — BETA HCG QUANT (REF LAB): hCG Quant: 845 m[IU]/mL

## 2017-03-04 NOTE — Progress Notes (Signed)
QHCG 845, will recheck in 1 week

## 2017-07-20 ENCOUNTER — Encounter: Payer: Self-pay | Admitting: Nurse Practitioner

## 2017-07-20 ENCOUNTER — Ambulatory Visit (INDEPENDENT_AMBULATORY_CARE_PROVIDER_SITE_OTHER): Payer: 59 | Admitting: Nurse Practitioner

## 2017-07-20 VITALS — BP 124/72 | HR 81 | Ht 64.0 in | Wt 216.4 lb

## 2017-07-20 DIAGNOSIS — Z01419 Encounter for gynecological examination (general) (routine) without abnormal findings: Secondary | ICD-10-CM

## 2017-07-20 DIAGNOSIS — Z124 Encounter for screening for malignant neoplasm of cervix: Secondary | ICD-10-CM

## 2017-07-20 DIAGNOSIS — Z1151 Encounter for screening for human papillomavirus (HPV): Secondary | ICD-10-CM | POA: Diagnosis not present

## 2017-07-21 ENCOUNTER — Encounter: Payer: Self-pay | Admitting: Nurse Practitioner

## 2017-07-21 LAB — BASIC METABOLIC PANEL
BUN / CREAT RATIO: 13 (ref 9–23)
BUN: 12 mg/dL (ref 6–20)
CO2: 22 mmol/L (ref 20–29)
CREATININE: 0.9 mg/dL (ref 0.57–1.00)
Calcium: 9.4 mg/dL (ref 8.7–10.2)
Chloride: 104 mmol/L (ref 96–106)
GFR calc non Af Amer: 86 mL/min/{1.73_m2} (ref >59)
GFR, EST AFRICAN AMERICAN: 99 mL/min/{1.73_m2} (ref >59)
Glucose: 93 mg/dL (ref 65–99)
Potassium: 4.6 mmol/L (ref 3.5–5.2)
Sodium: 140 mmol/L (ref 134–144)

## 2017-07-21 LAB — HEPATIC FUNCTION PANEL
ALT: 18 IU/L (ref 0–32)
AST: 16 IU/L (ref 0–40)
Albumin: 4.4 g/dL (ref 3.5–5.5)
Alkaline Phosphatase: 66 IU/L (ref 39–117)
Bilirubin Total: 0.4 mg/dL (ref 0.0–1.2)
Bilirubin, Direct: 0.13 mg/dL (ref 0.00–0.40)
Total Protein: 6.9 g/dL (ref 6.0–8.5)

## 2017-07-21 LAB — VITAMIN D 25 HYDROXY (VIT D DEFICIENCY, FRACTURES): VIT D 25 HYDROXY: 24.6 ng/mL — AB (ref 30.0–100.0)

## 2017-07-21 LAB — LIPID PANEL
Chol/HDL Ratio: 2.8 ratio (ref 0.0–4.4)
Cholesterol, Total: 140 mg/dL (ref 100–199)
HDL: 50 mg/dL (ref >39)
LDL Calculated: 78 mg/dL (ref 0–99)
TRIGLYCERIDES: 59 mg/dL (ref 0–149)
VLDL Cholesterol Cal: 12 mg/dL (ref 5–40)

## 2017-07-21 NOTE — Progress Notes (Signed)
   Subjective:    Patient ID: Mary [name], female    DOB: 01/06/1986, 32 y.o.   MRN: [id number]  HPI presents for her wellness exam.  Overall healthy diet.  Regular cycles, normal flow.  Same sexual partner for the past 2 years.  Has been trying to conceive.  Taking daily multivitamin.  Last cycle was 5/23.  Regular vision and dental exams.  Walks 3 times a week for exercise.    Review of Systems  Constitutional: Negative for activity change, appetite change and fatigue.  HENT: Negative for dental problem, ear pain, sinus pressure and sore throat.   Respiratory: Negative for cough, chest tightness, shortness of breath and wheezing.   Cardiovascular: Negative for chest pain.  Gastrointestinal: Negative for abdominal distention, abdominal pain, constipation, diarrhea, nausea and vomiting.  Genitourinary: Negative for difficulty urinating, dysuria, enuresis, frequency, genital sores, menstrual problem, pelvic pain, urgency and vaginal discharge.       Objective:   Physical Exam  Constitutional: She is oriented to person, place, and time. She appears well-developed. No distress.  HENT:  Right Ear: External ear normal.  Left Ear: External ear normal.  Mouth/Throat: Oropharynx is clear and moist.  Neck: Normal range of motion. Neck supple. No tracheal deviation present. No thyromegaly present.  Cardiovascular: Normal rate, regular rhythm and normal heart sounds. Exam reveals no gallop.  No murmur heard. Pulmonary/Chest: Effort normal and breath sounds normal. Right breast exhibits no inverted nipple, no mass, no skin change and no tenderness. Left breast exhibits no inverted nipple, no mass, no skin change and no tenderness. Breasts are symmetrical.  Axillae no adenopathy.  Abdominal: Soft. She exhibits no distension. There is no tenderness.  Genitourinary: Vagina normal and uterus normal. No vaginal discharge found.  Genitourinary Comments: External GU: No rashes or lesions.  Vagina no  discharge.  Cervix normal limit in appearance.  No CMT.  Bimanual exam no tenderness or obvious masses.  Lymphadenopathy:    She has no cervical adenopathy.  Neurological: She is alert and oriented to person, place, and time.  Skin: Skin is warm and dry. No rash noted.  Psychiatric: She has a normal mood and affect. Her behavior is normal.          Assessment & Plan:  Well woman exam with routine gynecological exam - Plan: Pap IG and HPV (high risk) DNA detection, Hepatic function panel, Vitamin D (25 hydroxy), Lipid Profile, Basic Metabolic Panel (BMET)  Screening for cervical cancer - Plan: Pap IG and HPV (high risk) DNA detection  Screening for HPV (human papillomavirus) - Plan: Pap IG and HPV (high risk) DNA detection  Routine lab work pending.  Encouraged healthy diet and regular activity.  Continue daily multivitamin. Return in about 1 year (around 07/21/2018) for physical.

## 2017-07-22 ENCOUNTER — Encounter: Payer: Self-pay | Admitting: Nurse Practitioner

## 2017-07-22 LAB — PAP IG AND HPV HIGH-RISK
HPV, HIGH-RISK: POSITIVE — AB
PAP SMEAR COMMENT: 0

## 2017-07-23 ENCOUNTER — Other Ambulatory Visit: Payer: Self-pay | Admitting: Nurse Practitioner

## 2017-07-23 DIAGNOSIS — R87810 Cervical high risk human papillomavirus (HPV) DNA test positive: Secondary | ICD-10-CM

## 2017-08-03 ENCOUNTER — Encounter (INDEPENDENT_AMBULATORY_CARE_PROVIDER_SITE_OTHER): Payer: Self-pay

## 2017-08-03 ENCOUNTER — Encounter: Payer: Self-pay | Admitting: Family Medicine

## 2017-08-06 ENCOUNTER — Encounter: Payer: Self-pay | Admitting: Obstetrics & Gynecology

## 2018-02-10 NOTE — L&D Delivery Note (Signed)
Delivery Note Complete at 1333. Labored down until 1509.Pt pushed well x ~15 minutes. Head delivered at 1528 PM. A shoulder Dystocia was recognized. See maneuvers below. At 1530 PM a viable female was delivered via Vaginal, Spontaneous.  Presentation: vertex; Position: Right,, Occiput,, Anterior that restituted to OP.   Delivery of the head: 1528. Attempted delivery of head w/ gentle downward traction w/ out success First Maneuver: McRoberts  Second maneuver: (Fetal head restituted at OP, then back to ROP) Attempted to grasp right axilla to deliver posterior shoulder. Unable to reach axilla well. Cut midline episiotomy. Instructed RN to call attending and NICU team.  Third maneuver: CNM able to reach right axilla. Applied traction. Some movement achieved. Rotated right shoulder to 7 o'clock. Posterior arm delivered and body delivered quickly. Cord clamped and and cut and infant handed to NICU team immediately. See note. Infant placed skin-to-skin w/ Mom. Alert, good tone.   APGAR: 3, 9; weight 9 lb 7.5 oz (4295 g).   Placenta status: Spontaneous, intact.   Cord: 3VC with the following complications: None.  Cord pH: NA  Anesthesia: Anesthesia   Episiotomy: Median Lacerations: 2nd degree;Perineal Suture Repair: 3.0 vicryl rapide Est. Blood Loss (mL): 381  Mom to postpartum.  Baby to Couplet care / Skin to Skin. Placenta to: L&D Feeding: Breast Circ: IP Contraception: BTL at 6 months. Will discuss interval [facility] tomorrow.   CBC in AM  [place] 01/02/2019, 4:49 PM

## 2018-05-13 ENCOUNTER — Encounter: Payer: Self-pay | Admitting: *Deleted

## 2018-05-13 ENCOUNTER — Telehealth: Payer: Self-pay | Admitting: *Deleted

## 2018-05-13 NOTE — Telephone Encounter (Signed)
Left message for pt to call office so we can instruct her on how to get [employer] meeting on her phone. No need for her to come in office. JSY

## 2018-05-14 ENCOUNTER — Other Ambulatory Visit: Payer: Self-pay

## 2018-05-14 ENCOUNTER — Ambulatory Visit: Payer: 59 | Admitting: Adult Health

## 2018-05-14 ENCOUNTER — Ambulatory Visit (INDEPENDENT_AMBULATORY_CARE_PROVIDER_SITE_OTHER): Payer: 59 | Admitting: Adult Health

## 2018-05-14 ENCOUNTER — Encounter: Payer: Self-pay | Admitting: Adult Health

## 2018-05-14 VITALS — BP 114/80 | HR 68 | Ht 64.0 in | Wt 178.4 lb

## 2018-05-14 DIAGNOSIS — Z3A01 Less than 8 weeks gestation of pregnancy: Secondary | ICD-10-CM | POA: Diagnosis not present

## 2018-05-14 DIAGNOSIS — O3680X0 Pregnancy with inconclusive fetal viability, not applicable or unspecified: Secondary | ICD-10-CM | POA: Diagnosis not present

## 2018-05-14 DIAGNOSIS — Z13220 Encounter for screening for lipoid disorders: Secondary | ICD-10-CM | POA: Insufficient documentation

## 2018-05-14 DIAGNOSIS — Z7689 Persons encountering health services in other specified circumstances: Secondary | ICD-10-CM | POA: Diagnosis not present

## 2018-05-14 DIAGNOSIS — Z3201 Encounter for pregnancy test, result positive: Secondary | ICD-10-CM | POA: Insufficient documentation

## 2018-05-14 NOTE — Progress Notes (Signed)
Patient ID: Mary [name], female   DOB: November 06, 1985, 33 y.o.   MRN: [id number]   TELEHEALTH VIRTUAL GYNECOLOGY VISIT ENCOUNTER NOTE  I connected with Mary [name] on 05/14/18 at 11:00 AM EDT by telephone at home and verified that I am speaking with the correct person using two identifiers.   I discussed the limitations, risks, security and privacy concerns of performing an evaluation and management service by telephone and the availability of in person appointments. I also discussed with the patient that there may be a patient responsible charge related to this service. The patient expressed understanding and agreed to proceed.    History:  Mary [name] is a 33 y.o. [phone] female being evaluated today for +HPTs x 3, after missing a period. She has had some nausea.She is about 6+2 weeks by LMP with EDD 01/05/2019. She had miscarriage last year.And is working at the [employer].   She denies any abnormal vaginal discharge, bleeding, pelvic pain or other concerns.    PCP is [facility].    Past Medical History:  Diagnosis Date  . UTI (urinary tract infection) in pregnancy in first trimester 05/14/2012  . Wisdom teeth extracted 2007   Past Surgical History:  Procedure Laterality Date  . NO PAST SURGERIES     The following portions of the patient's history were reviewed and updated as appropriate: allergies, current medications, past family history, past medical history, past social history, past surgical history and problem list.   Health Maintenance:  Normal pap and positive  HRHPV on 07/20/17, this was her second +HPV in a row  Review of Systems:  Pertinent items noted in HPI and remainder of comprehensive ROS otherwise negative.  Physical Exam:  Physical exam deferred due to nature of the encounter,BP 114/80 (BP Location: Left Arm, Patient Position: Sitting)   Pulse 68   Ht 5\' 4"  (1.626 m)   Wt 178 lb 6.4 oz (80.9 kg)   LMP 03/31/2018   Breastfeeding No   BMI 30.62 kg/m    Will check QHCG and progesterone levels. Dating [place] in about 1 week  Labs and Imaging No results found for this or any previous visit (from the past 336 hour(s)). No results found.    Assessment and Plan:     1. Positive pregnancy test        3+HPTs  2. Less than [redacted] weeks gestation of pregnancy - Beta hCG quant (ref lab) - Progesterone  3. Encounter to determine fetal viability of pregnancy, single or unspecified fetus - US OB Comp Less 14 Wks; Future       I discussed the assessment and treatment plan with the patient. The patient was provided an opportunity to ask questions and all were answered. The patient agreed with the plan and demonstrated an understanding of the instructions.   The patient was advised to call back or seek an in-person evaluation/go to the ED if the symptoms worsen or if the condition fails to improve as anticipated.  I provided 7 minutes of non-face-to-face time during this encounter.   Cyril Mourning, NP [facility] [employer], [facility]

## 2018-05-15 LAB — PROGESTERONE: Progesterone: 21.3 ng/mL

## 2018-05-15 LAB — BETA HCG QUANT (REF LAB): hCG Quant: 39190 m[IU]/mL

## 2018-05-24 ENCOUNTER — Other Ambulatory Visit: Payer: Self-pay

## 2018-05-24 ENCOUNTER — Ambulatory Visit (INDEPENDENT_AMBULATORY_CARE_PROVIDER_SITE_OTHER): Payer: 59

## 2018-05-24 ENCOUNTER — Other Ambulatory Visit: Payer: 59

## 2018-05-24 DIAGNOSIS — Z3A01 Less than 8 weeks gestation of pregnancy: Secondary | ICD-10-CM | POA: Diagnosis not present

## 2018-05-24 DIAGNOSIS — O3680X0 Pregnancy with inconclusive fetal viability, not applicable or unspecified: Secondary | ICD-10-CM

## 2018-05-24 DIAGNOSIS — R3915 Urgency of urination: Secondary | ICD-10-CM

## 2018-05-24 LAB — POCT URINALYSIS DIPSTICK OB
Blood, UA: NEGATIVE
Glucose, UA: NEGATIVE
Ketones, UA: NEGATIVE
Leukocytes, UA: NEGATIVE
Nitrite, UA: NEGATIVE
POC,PROTEIN,UA: NEGATIVE

## 2018-05-24 NOTE — Progress Notes (Signed)
[place] 7+5 wks,single IUP w/ys,positive fht 176 bpm,normal ovaries bilat,crl 17.58 mm

## 2018-05-24 NOTE — Progress Notes (Signed)
Pt here for [place] and requested that we check her urine due to feelings of urgency. Urine dip was negative. Advised to drink lots of water. JSY

## 2018-06-28 ENCOUNTER — Other Ambulatory Visit: Payer: Self-pay | Admitting: Obstetrics and Gynecology

## 2018-06-28 ENCOUNTER — Encounter: Payer: Self-pay | Admitting: *Deleted

## 2018-06-28 DIAGNOSIS — Z3682 Encounter for antenatal screening for nuchal translucency: Secondary | ICD-10-CM

## 2018-06-29 ENCOUNTER — Ambulatory Visit (INDEPENDENT_AMBULATORY_CARE_PROVIDER_SITE_OTHER): Payer: 59 | Admitting: Women's Health

## 2018-06-29 ENCOUNTER — Other Ambulatory Visit: Payer: Self-pay

## 2018-06-29 ENCOUNTER — Ambulatory Visit: Payer: 59 | Admitting: *Deleted

## 2018-06-29 ENCOUNTER — Ambulatory Visit (INDEPENDENT_AMBULATORY_CARE_PROVIDER_SITE_OTHER): Payer: 59

## 2018-06-29 ENCOUNTER — Encounter: Payer: Self-pay | Admitting: Women's Health

## 2018-06-29 VITALS — BP 113/78 | HR 71 | Wt 179.0 lb

## 2018-06-29 DIAGNOSIS — Z331 Pregnant state, incidental: Secondary | ICD-10-CM

## 2018-06-29 DIAGNOSIS — Z3481 Encounter for supervision of other normal pregnancy, first trimester: Secondary | ICD-10-CM

## 2018-06-29 DIAGNOSIS — Z3A12 12 weeks gestation of pregnancy: Secondary | ICD-10-CM

## 2018-06-29 DIAGNOSIS — Z3682 Encounter for antenatal screening for nuchal translucency: Secondary | ICD-10-CM | POA: Diagnosis not present

## 2018-06-29 DIAGNOSIS — Z1389 Encounter for screening for other disorder: Secondary | ICD-10-CM

## 2018-06-29 DIAGNOSIS — Z363 Encounter for antenatal screening for malformations: Secondary | ICD-10-CM

## 2018-06-29 NOTE — Progress Notes (Signed)
INITIAL OBSTETRICAL VISIT Patient name: Mary [name] MRN [id number]  Date of birth: 02-01-1986 Chief Complaint:   Initial Prenatal Visit  History of Present Illness:   Mary [name] is a 33 y.o. G75P1021 Caucasian female at [redacted]w[redacted]d by LMP c/w 8wk u/s, with an Estimated Date of Delivery: 01/05/19 being seen today for her initial obstetrical visit.   Her obstetrical history is significant for term uncomplicated SVB x 1, SAB x 2.   Today she reports mild n/v, declines meds.  Patient's last menstrual period was 03/31/2018. Last pap 07/20/17. Results were: neg w/ +HRHPV Review of Systems:   Pertinent items are noted in HPI Denies cramping/contractions, leakage of fluid, vaginal bleeding, abnormal vaginal discharge w/ itching/odor/irritation, headaches, visual changes, shortness of breath, chest pain, abdominal pain, severe nausea/vomiting, or problems with urination or bowel movements unless otherwise stated above.  Pertinent History Reviewed:  Reviewed past medical,surgical, social, obstetrical and family history.  Reviewed problem list, medications and allergies. OB History  Gravida Para Term Preterm AB Living  4 1 1   2 1   SAB TAB Ectopic Multiple Live Births  2       1    # Outcome Date GA Lbr Len/2nd Weight Sex Delivery Anes PTL Lv  4 Current           3 Term 12/02/12 [redacted]w[redacted]d 04:01 / 00:21 7 lb 1.4 oz (3.215 kg) F Vag-Spont EPI  LIV     Birth Comments: No problems at birth  2 SAB 04/2011          1 SAB            Physical Assessment:   Vitals:   06/29/18 1111  BP: 113/78  Pulse: 71  Weight: 179 lb (81.2 kg)  Body mass index is 30.73 kg/m.       Physical Examination:  General appearance - well appearing, and in no distress  Mental status - alert, oriented to person, place, and time  Psych:  She has a normal mood and affect  Skin - warm and dry, normal color, no suspicious lesions noted  Chest - effort normal, all lung fields clear to auscultation bilaterally  Heart -  normal rate and regular rhythm  Abdomen - soft, nontender  Extremities:  No swelling or varicosities noted  Thin prep pap is not done   Fetal Heart Rate (bpm): 161 u/s via u/s  TODAY'S NT [place] 12+6 wks,measurements c/w dates,normal ovaries bilat,crl 73.88 mm,posterior placenta gr 0,NB present,fhr 161 bpm,NT 1.8 mm  No results found for this or any previous visit (from the past 24 hour(s)).  Assessment & Plan:  1) Low-Risk Pregnancy [id number] at [redacted]w[redacted]d with an Estimated Date of Delivery: 01/05/19   2) Initial OB visit  3) H/O abnormal pap>needs repeat after 6/10  Meds: No orders of the defined types were placed in this encounter.   Initial labs obtained Continue prenatal vitamins Reviewed n/v relief measures and warning s/s to report Reviewed recommended weight gain based on pre-gravid BMI Encouraged well-balanced diet Genetic Screening discussed: requested nt/it Cystic fibrosis, SMA, Fragile X screening discussed requested Ultrasound discussed; fetal survey: requested CCNC completed>not applying for preg mcaid  Follow-up: Return in about 5 weeks (around 08/04/2018) for LROB, JD:BZMCEYE, 2nd IT.   Orders Placed This Encounter  Procedures  . Urine Culture  . GC/Chlamydia Probe Amp  . US OB Comp + 14 Wk  . Pain Management Screening Profile (10S)  . Urinalysis  . Obstetric Panel, Including  HIV  . Sickle cell screen  . Inheritest Core(CF97,SMA,FraX)  . Integrated 1  . POC Urinalysis Dipstick OB    Cheral MarkerKimberly R Raley Novicki CNM, [facility]NP-BC 06/29/2018 1:03 PM

## 2018-06-29 NOTE — Progress Notes (Addendum)
[place] 12+6 wks,measurements c/w dates,normal ovaries bilat,crl 73.88 mm,posterior placenta gr 0,NB present,fhr 161 bpm,unable to obtain NT measurement,pt will come back after appt w/[name]

## 2018-06-29 NOTE — Patient Instructions (Signed)
Mary [name], I greatly value your feedback.  If you receive a survey following your visit with [place] today, we appreciate you taking the time to fill it out.  Thanks, Joellyn Haff, CNM, [facility]  [facility] HAS MOVED!!! It is now [facility] ([address] [place], [place] [address]) Entrance located off of [address] Free 24/7 valet parking   Home Blood Pressure Monitoring for Patients   Your provider has recommended that you check your blood pressure (BP) at least once a week at home. If you do not have a blood pressure cuff at home, one will be provided for you. Contact your provider if you have not received your monitor within 1 week.   Helpful Tips for Accurate Home Blood Pressure Checks  . Don't smoke, exercise, or drink caffeine 30 minutes before checking your BP . Use the restroom before checking your BP (a full bladder can raise your pressure) . Relax in a comfortable upright chair . Feet on the ground . Left arm resting comfortably on a flat surface at the level of your heart . Legs uncrossed . Back supported . Sit quietly and don't talk . Place the cuff on your bare arm . Adjust snuggly, so that only two fingertips can fit between your skin and the top of the cuff . Check 2 readings separated by at least one minute . Keep a log of your BP readings . For a visual, please reference this diagram: http://ccnc.care/bpdiagram  Provider Name: [facility]     Phone: [phone]  Zone 1: ALL CLEAR  Continue to monitor your symptoms:  . BP reading is less than 140 (top number) or less than 90 (bottom number)  . No right upper stomach pain . No headaches or seeing spots . No feeling nauseated or throwing up . No swelling in face and hands  Zone 2: CAUTION Call your doctor's office for any of the following:  . BP reading is greater than 140 (top number) or greater than 90 (bottom number)  . Stomach pain under your ribs in the middle  or right side . Headaches or seeing spots . Feeling nauseated or throwing up . Swelling in face and hands  Zone 3: EMERGENCY  Seek immediate medical care if you have any of the following:  . BP reading is greater than160 (top number) or greater than 110 (bottom number) . Severe headaches not improving with Tylenol . Serious difficulty catching your breath . Any worsening symptoms from Zone 2     Nausea & Vomiting  Have saltine crackers or pretzels by your bed and eat a few bites before you raise your head out of bed in the morning  Eat small frequent meals throughout the day instead of large meals  Drink plenty of fluids throughout the day to stay hydrated, just don't drink a lot of fluids with your meals.  This can make your stomach fill up faster making you feel sick  Do not brush your teeth right after you eat  Products with real ginger are good for nausea, like ginger ale and ginger hard candy Make sure it says made with real ginger!  Sucking on sour candy like lemon heads is also good for nausea  If your prenatal vitamins make you nauseated, take them at night so you will sleep through the nausea  Sea Bands  If you feel like you need medicine for the nausea & vomiting please let us know  If you are unable to keep any fluids or food down please let us know   Constipation  Drink plenty of fluid, preferably water, throughout the day  Eat foods high in fiber such as fruits, vegetables, and grains  Exercise, such as walking, is a good way to keep your bowels regular  Drink warm fluids, especially warm prune juice, or decaf coffee  Eat a 1/2 cup of real oatmeal (not instant), 1/2 cup applesauce, and 1/2-1 cup warm prune juice every day  If needed, you may take Colace (docusate sodium) stool softener once or twice a day to help keep the stool soft. If you are pregnant, wait until you are out of your first trimester (12-14 weeks of pregnancy)  If you still are having  problems with constipation, you may take Miralax once daily as needed to help keep your bowels regular.  If you are pregnant, wait until you are out of your first trimester (12-14 weeks of pregnancy)   First Trimester of Pregnancy The first trimester of pregnancy is from week 1 until the end of week 12 (months 1 through 3). A week after a sperm fertilizes an egg, the egg will implant on the wall of the uterus. This embryo will begin to develop into a baby. Genes from you and your partner are forming the baby. The female genes determine whether the baby is a boy or a girl. At 6-8 weeks, the eyes and face are formed, and the heartbeat can be seen on ultrasound. At the end of 12 weeks, all the baby's organs are formed.  Now that you are pregnant, you will want to do everything you can to have a healthy baby. Two of the most important things are to get good prenatal care and to follow your health care provider's instructions. Prenatal care is all the medical care you receive before the baby's birth. This care will help prevent, find, and treat any problems during the pregnancy and childbirth. BODY CHANGES Your body goes through many changes during pregnancy. The changes vary from woman to woman.   You may gain or lose a couple of pounds at first.  You may feel sick to your stomach (nauseous) and throw up (vomit). If the vomiting is uncontrollable, call your health care provider.  You may tire easily.  You may develop headaches that can be relieved by medicines approved by your health care provider.  You may urinate more often. Painful urination may mean you have a bladder infection.  You may develop heartburn as a result of your pregnancy.  You may develop constipation because certain hormones are causing the muscles that push waste through your intestines to slow down.  You may develop hemorrhoids or swollen, bulging veins (varicose veins).  Your breasts may begin to grow larger and become tender.  Your nipples may stick out more, and the tissue that surrounds them (areola) may become darker.  Your gums may bleed and may be sensitive to brushing and flossing.  Dark spots or blotches (chloasma, mask of pregnancy) may develop on your face. This will likely fade after the baby is born.  Your menstrual periods will stop.  You may have a loss of appetite.  You may develop cravings for certain kinds of food.  You may have changes in your emotions from day to day, such as being excited to be pregnant or being concerned that something may go wrong with the pregnancy and baby.  You may have more vivid and strange dreams.  You may have changes in your hair. These can include thickening of your hair, rapid growth, and changes in texture. Some women also have hair loss during or after pregnancy, or hair that feels dry or thin. Your hair will most likely return to normal after your baby is born. WHAT TO EXPECT AT YOUR PRENATAL VISITS During a routine prenatal visit:  You will be weighed to make sure you and the baby are growing normally.  Your blood pressure will be taken.  Your abdomen will be measured to track your baby's growth.  The fetal heartbeat will be listened to starting around week 10 or 12 of your pregnancy.  Test results from any previous visits will be discussed. Your health care provider may ask you:  How you are feeling.  If you are feeling the baby move.  If you have had any abnormal symptoms, such as leaking fluid, bleeding, severe headaches, or abdominal cramping.  If you have any questions. Other tests that may be performed during your first trimester include:  Blood tests to find your blood type and to check for the presence of any previous infections. They will also be used to check for low iron levels (anemia) and Rh antibodies. Later in the pregnancy, blood tests for diabetes will be done along with other tests if problems develop.  Urine tests to check for  infections, diabetes, or protein in the urine.  An ultrasound to confirm the proper growth and development of the baby.  An amniocentesis to check for possible genetic problems.  Fetal screens for spina bifida and Down syndrome.  You may need other tests to make sure you and the baby are doing well. HOME CARE INSTRUCTIONS  Medicines  Follow your health care provider's instructions regarding medicine use. Specific medicines may be either safe or unsafe to take during pregnancy.  Take your prenatal vitamins as directed.  If you develop constipation, try taking a stool softener if your health care provider approves. Diet  Eat regular, well-balanced meals. Choose a variety of foods, such as meat or vegetable-based protein, fish, milk and low-fat dairy products, vegetables, fruits, and whole grain breads and cereals. Your health care provider will help you determine the amount of weight gain that is right for you.  Avoid raw meat and uncooked cheese. These carry germs that can cause birth defects in the baby.  Eating four or five small meals rather than three large meals a day may help relieve nausea and vomiting. If you start to feel nauseous, eating a few soda crackers can be helpful. Drinking liquids between meals instead of during meals also seems to help nausea and vomiting.  If you develop constipation, eat more high-fiber foods, such as fresh vegetables or fruit and whole grains. Drink enough fluids to keep your urine clear or pale yellow. Activity and Exercise  Exercise only as directed by your health care provider. Exercising will help you:  Control your weight.  Stay in shape.  Be prepared for labor and delivery.  Experiencing pain or cramping in the lower abdomen or low back is a good sign that you should stop exercising. Check with your health care provider before continuing normal exercises.  Try to avoid standing for long periods of time. Move your legs often if you  must stand in one place for a long time.  Avoid heavy lifting.  Wear low-heeled shoes, and practice good posture.  You may continue to have sex unless your health care provider directs you otherwise.  Relief of Pain or Discomfort  Wear a good support bra for breast tenderness.    Take warm sitz baths to soothe any pain or discomfort caused by hemorrhoids. Use hemorrhoid cream if your health care provider approves.    Rest with your legs elevated if you have leg cramps or low back pain.  If you develop varicose veins in your legs, wear support hose. Elevate your feet for 15 minutes, 3-4 times a day. Limit salt in your diet. Prenatal Care  Schedule your prenatal visits by the twelfth week of pregnancy. They are usually scheduled monthly at first, then more often in the last 2 months before delivery.  Write down your questions. Take them to your prenatal visits.  Keep all your prenatal visits as directed by your health care provider. Safety  Wear your seat belt at all times when driving.  Make a list of emergency phone numbers, including numbers for family, friends, the hospital, and police and fire departments. General Tips  Ask your health care provider for a referral to a local prenatal education class. Begin classes no later than at the beginning of month 6 of your pregnancy.  Ask for help if you have counseling or nutritional needs during pregnancy. Your health care provider can offer advice or refer you to specialists for help with various needs.  Do not use hot tubs, steam rooms, or saunas.  Do not douche or use tampons or scented sanitary pads.  Do not cross your legs for long periods of time.  Avoid cat litter boxes and soil used by cats. These carry germs that can cause birth defects in the baby and possibly loss of the fetus by miscarriage or stillbirth.  Avoid all smoking, herbs, alcohol, and medicines not prescribed by your health care provider. Chemicals in these  affect the formation and growth of the baby.  Schedule a dentist appointment. At home, brush your teeth with a soft toothbrush and be gentle when you floss. SEEK MEDICAL CARE IF:   You have dizziness.  You have mild pelvic cramps, pelvic pressure, or nagging pain in the abdominal area.  You have persistent nausea, vomiting, or diarrhea.  You have a bad smelling vaginal discharge.  You have pain with urination.  You notice increased swelling in your face, hands, legs, or ankles. SEEK IMMEDIATE MEDICAL CARE IF:   You have a fever.  You are leaking fluid from your vagina.  You have spotting or bleeding from your vagina.  You have severe abdominal cramping or pain.  You have rapid weight gain or loss.  You vomit blood or material that looks like coffee grounds.  You are exposed to [place] measles and have never had them.  You are exposed to fifth disease or chickenpox.  You develop a severe headache.  You have shortness of breath.  You have any kind of trauma, such as from a fall or a car accident. Document Released: 01/21/2001 Document Revised: 06/13/2013 Document Reviewed: 12/07/2012 [facility] Patient Information 2015 [place], [place]. This information is not intended to replace advice given to you by your health care provider. Make sure you discuss any questions you have with your health care provider.   Coronavirus (COVID-19) Are you at risk?  Are you at risk for the Coronavirus (COVID-19)?  To be considered HIGH RISK for Coronavirus (COVID-19), you have to meet the following criteria:  . Traveled to [place], [place], [place], [place] or [place]; or in the [place] to [place], [place], [place],  or [place]; and have fever, cough, and shortness of breath within the last 2 weeks of travel OR . Been in close contact with a person diagnosed with COVID-19 within the last 2 weeks and have fever, cough, and shortness of breath . IF YOU DO NOT MEET THESE CRITERIA, YOU  ARE CONSIDERED LOW RISK FOR COVID-19.  What to do if you are HIGH RISK for COVID-19?  Marland Kitchen If you are having a medical emergency, call 911. . Seek medical care right away. Before you go to a doctor's office, urgent care or emergency department, call ahead and tell them about your recent travel, contact with someone diagnosed with COVID-19, and your symptoms. You should receive instructions from your physician's office regarding next steps of care.  . When you arrive at healthcare provider, tell the healthcare staff immediately you have returned from visiting [place], [place], [place], [place] or [place]; or traveled in the [place] to [place], [place], [place], or [place]; in the last two weeks or you have been in close contact with a person diagnosed with COVID-19 in the last 2 weeks.   . Tell the health care staff about your symptoms: fever, cough and shortness of breath. . After you have been seen by a medical provider, you will be either: o Tested for (COVID-19) and discharged home on quarantine except to seek medical care if symptoms worsen, and asked to  - Stay home and avoid contact with others until you get your results (4-5 days)  - Avoid travel on public transportation if possible (such as bus, train, or airplane) or o Sent to the Emergency Department by EMS for evaluation, COVID-19 testing, and possible admission depending on your condition and test results.  What to do if you are LOW RISK for COVID-19?  Reduce your risk of any infection by using the same precautions used for avoiding the common cold or flu:  Marland Kitchen Wash your hands often with soap and warm water for at least 20 seconds.  If soap and water are not readily available, use an alcohol-based hand sanitizer with at least 60% alcohol.  . If coughing or sneezing, cover your mouth and nose by coughing or sneezing into the elbow areas of your shirt or coat, into a tissue or into your sleeve (not your hands). . Avoid shaking  hands with others and consider head nods or verbal greetings only. . Avoid touching your eyes, nose, or mouth with unwashed hands.  . Avoid close contact with people who are sick. . Avoid places or events with large numbers of people in one location, like concerts or sporting events. . Carefully consider travel plans you have or are making. . If you are planning any travel outside or inside the [place], visit the CDC's Travelers' Health webpage for the latest health notices. . If you have some symptoms but not all symptoms, continue to monitor at home and seek medical attention if your symptoms worsen. . If you are having a medical emergency, call 911.   [place] / e-Visit: [email or web address]         [facility]: [facility]: [phone]                   [facility]: [phone]

## 2018-06-30 ENCOUNTER — Other Ambulatory Visit: Payer: 59

## 2018-06-30 ENCOUNTER — Encounter: Payer: 59 | Admitting: Advanced Practice Midwife

## 2018-06-30 ENCOUNTER — Ambulatory Visit: Payer: 59 | Admitting: *Deleted

## 2018-06-30 LAB — OBSTETRIC PANEL, INCLUDING HIV
Antibody Screen: NEGATIVE
Basophils Absolute: 0 10*3/uL (ref 0.0–0.2)
Basos: 0 %
EOS (ABSOLUTE): 0 10*3/uL (ref 0.0–0.4)
Eos: 0 %
HIV Screen 4th Generation wRfx: NONREACTIVE
Hematocrit: 36.8 % (ref 34.0–46.6)
Hemoglobin: 13 g/dL (ref 11.1–15.9)
Hepatitis B Surface Ag: NEGATIVE
Immature Grans (Abs): 0 10*3/uL (ref 0.0–0.1)
Immature Granulocytes: 0 %
Lymphocytes Absolute: 2.2 10*3/uL (ref 0.7–3.1)
Lymphs: 23 %
MCH: 31.8 pg (ref 26.6–33.0)
MCHC: 35.3 g/dL (ref 31.5–35.7)
MCV: 90 fL (ref 79–97)
Monocytes Absolute: 0.3 10*3/uL (ref 0.1–0.9)
Monocytes: 3 %
Neutrophils Absolute: 6.7 10*3/uL (ref 1.4–7.0)
Neutrophils: 74 %
Platelets: 233 10*3/uL (ref 150–450)
RBC: 4.09 x10E6/uL (ref 3.77–5.28)
RDW: 12.1 % (ref 11.7–15.4)
RPR Ser Ql: NONREACTIVE
Rh Factor: POSITIVE
Rubella Antibodies, IGG: <0.9 index — ABNORMAL LOW (ref >0.99)
WBC: 9.3 10*3/uL (ref 3.4–10.8)

## 2018-06-30 LAB — URINALYSIS
Bilirubin, UA: NEGATIVE
Glucose, UA: NEGATIVE
Ketones, UA: NEGATIVE
Nitrite, UA: NEGATIVE
Protein,UA: NEGATIVE
RBC, UA: NEGATIVE
Specific Gravity, UA: 1.008 (ref 1.005–1.030)
Urobilinogen, Ur: 0.2 mg/dL (ref 0.2–1.0)
pH, UA: 7 (ref 5.0–7.5)

## 2018-06-30 LAB — INHERITEST CORE(CF97,SMA,FRAX)

## 2018-06-30 LAB — SICKLE CELL SCREEN: Sickle Cell Screen: NEGATIVE

## 2018-07-01 LAB — INTEGRATED 1
Crown Rump Length: 73.9 mm
Gest. Age on Collection Date: 13.3 weeks
Maternal Age at EDD: 31.5 yr
Nuchal Translucency (NT): 1.8 mm
Number of Fetuses: 1
PAPP-A Value: 792.4 ng/mL
Weight: 175 [lb_av]

## 2018-07-02 ENCOUNTER — Other Ambulatory Visit: Payer: 59

## 2018-07-05 ENCOUNTER — Encounter: Payer: Self-pay | Admitting: Women's Health

## 2018-07-05 DIAGNOSIS — Z283 Underimmunization status: Secondary | ICD-10-CM | POA: Insufficient documentation

## 2018-07-05 DIAGNOSIS — O09899 Supervision of other high risk pregnancies, unspecified trimester: Secondary | ICD-10-CM | POA: Insufficient documentation

## 2018-07-05 DIAGNOSIS — Z2839 Other underimmunization status: Secondary | ICD-10-CM | POA: Insufficient documentation

## 2018-07-05 DIAGNOSIS — Z3490 Encounter for supervision of normal pregnancy, unspecified, unspecified trimester: Secondary | ICD-10-CM | POA: Insufficient documentation

## 2018-07-14 LAB — INHERITEST CORE(CF97,SMA,FRAX)

## 2018-08-03 ENCOUNTER — Other Ambulatory Visit: Payer: Self-pay

## 2018-08-03 ENCOUNTER — Other Ambulatory Visit (HOSPITAL_COMMUNITY)
Admission: RE | Admit: 2018-08-03 | Discharge: 2018-08-03 | Disposition: A | Discharge status: Home / Self Care | Payer: 59 | Source: Ambulatory Visit | Attending: Obstetrics and Gynecology | Admitting: Obstetrics and Gynecology

## 2018-08-03 ENCOUNTER — Encounter: Payer: Self-pay | Admitting: Women's Health

## 2018-08-03 ENCOUNTER — Ambulatory Visit (INDEPENDENT_AMBULATORY_CARE_PROVIDER_SITE_OTHER): Payer: 59

## 2018-08-03 ENCOUNTER — Ambulatory Visit (INDEPENDENT_AMBULATORY_CARE_PROVIDER_SITE_OTHER): Payer: 59 | Admitting: Women's Health

## 2018-08-03 VITALS — BP 114/83 | HR 72

## 2018-08-03 DIAGNOSIS — Z363 Encounter for antenatal screening for malformations: Secondary | ICD-10-CM

## 2018-08-03 DIAGNOSIS — Z131 Encounter for screening for diabetes mellitus: Secondary | ICD-10-CM

## 2018-08-03 DIAGNOSIS — O2342 Unspecified infection of urinary tract in pregnancy, second trimester: Secondary | ICD-10-CM

## 2018-08-03 DIAGNOSIS — Z124 Encounter for screening for malignant neoplasm of cervix: Secondary | ICD-10-CM

## 2018-08-03 DIAGNOSIS — Z3A17 17 weeks gestation of pregnancy: Secondary | ICD-10-CM | POA: Insufficient documentation

## 2018-08-03 DIAGNOSIS — O3662X0 Maternal care for excessive fetal growth, second trimester, not applicable or unspecified: Secondary | ICD-10-CM | POA: Diagnosis not present

## 2018-08-03 DIAGNOSIS — Z3482 Encounter for supervision of other normal pregnancy, second trimester: Secondary | ICD-10-CM | POA: Diagnosis not present

## 2018-08-03 DIAGNOSIS — Z331 Pregnant state, incidental: Secondary | ICD-10-CM

## 2018-08-03 DIAGNOSIS — Z3481 Encounter for supervision of other normal pregnancy, first trimester: Secondary | ICD-10-CM

## 2018-08-03 DIAGNOSIS — Z1379 Encounter for other screening for genetic and chromosomal anomalies: Secondary | ICD-10-CM

## 2018-08-03 DIAGNOSIS — Z1389 Encounter for screening for other disorder: Secondary | ICD-10-CM

## 2018-08-03 LAB — POCT URINALYSIS DIPSTICK OB
Blood, UA: NEGATIVE
Glucose, UA: NEGATIVE
Ketones, UA: NEGATIVE
Leukocytes, UA: NEGATIVE
Nitrite, UA: POSITIVE
POC,PROTEIN,UA: NEGATIVE

## 2018-08-03 MED ORDER — NITROFURANTOIN MONOHYD MACRO 100 MG PO CAPS: 100.0000 mg | ORAL_CAPSULE | Freq: Two times a day (BID) | ORAL | 0 refills | Status: DC

## 2018-08-03 NOTE — Progress Notes (Signed)
LOW-RISK PREGNANCY VISIT Patient name: Mary [name] MRN [id number]  Date of birth: Jul 22, 1985 Chief Complaint:   Routine Prenatal Visit (anatomy scan)  History of Present Illness:   Mary [name] is a 33 y.o. [phone] female at [redacted]w[redacted]d with an Estimated Date of Delivery: 01/05/19 being seen today for ongoing management of a low-risk pregnancy.  Today she reports no complaints. Contractions: Not present. Vag. Bleeding: None.  Movement: Present. denies leaking of fluid. Review of Systems:   Pertinent items are noted in HPI Denies abnormal vaginal discharge w/ itching/odor/irritation, headaches, visual changes, shortness of breath, chest pain, abdominal pain, severe nausea/vomiting, or problems with urination or bowel movements unless otherwise stated above. Pertinent History Reviewed:  Reviewed past medical,surgical, social, obstetrical and family history.  Reviewed problem list, medications and allergies. Physical Assessment:   Vitals:   08/03/18 0927  BP: 114/83  Pulse: 72  There is no height or weight on file to calculate BMI.        Physical Examination:   General appearance: Well appearing, and in no distress  Mental status: Alert, oriented to person, place, and time  Skin: Warm & dry  Cardiovascular: Normal heart rate noted  Respiratory: Normal respiratory effort, no distress  Abdomen: Soft, gravid, nontender  Pelvic: thin prep pap obtained         Extremities: Edema: None  Fetal Status: Fetal Heart Rate (bpm): 152 u/s   Movement: Present     [place] 17+6 wks,breech,posterior placenta gr 0,cx 4.3 cm,svp of fluid 4.1 cm,normal ovaries bilat,fhr 152 bpm,efw 265 g 95%,anatomy complete   Results for orders placed or performed in visit on 08/03/18 (from the past 24 hour(s))  POC Urinalysis Dipstick OB   Collection Time: 08/03/18  9:28 AM  Result Value Ref Range   Color, UA     Clarity, UA     Glucose, UA Negative Negative   Bilirubin, UA     Ketones, UA neg    Spec Grav, UA      Blood, UA neg    pH, UA     POC,PROTEIN,UA Negative Negative, Trace, Small (1+), Moderate (2+), Large (3+), 4+   Urobilinogen, UA     Nitrite, UA positive    Leukocytes, UA Negative Negative   Appearance     Odor      Assessment & Plan:  1) Low-risk pregnancy G4P1021 at [redacted]w[redacted]d with an Estimated Date of Delivery: 01/05/19   2) LGA, on today's u/, 95%, no h/o DM, last baby AGA, will check A1C  3) UTI> rx macrobid, send cx   Meds:  Meds ordered this encounter  Medications  . nitrofurantoin, macrocrystal-monohydrate, (MACROBID) 100 MG capsule    Sig: Take 1 capsule (100 mg total) by mouth 2 (two) times daily. X 7 days    Dispense:  14 capsule    Refill:  0    Order Specific Question:   Supervising Provider    Answer:   Tania Ade H [[id number]]   Labs/procedures today: A1C, 2nd IT, anatomy u/s  Plan:  Continue routine obstetrical care   Reviewed: Preterm labor symptoms and general obstetric precautions including but not limited to vaginal bleeding, contractions, leaking of fluid and fetal movement were reviewed in detail with the patient.  All questions were answered. Has home bp cuff. Check bp weekly, let us know if >140/90.   Follow-up: Return in about 4 weeks (around 08/31/2018) for LROB webex.  Orders Placed This Encounter  Procedures  . Urine Culture  .  Urinalysis, Routine w reflex microscopic  . Pain Management Screening Profile (10S)  . Hemoglobin A1c  . INTEGRATED 2  . POC Urinalysis Dipstick OB   Cheral MarkerKimberly R Xavius Spadafore CNM, [facility]NP-BC 08/03/2018 9:51 AM

## 2018-08-03 NOTE — Progress Notes (Signed)
[place] 17+6 wks,breech,posterior placenta gr 0,cx 4.3 cm,svp of fluid 4.1 cm,normal ovaries bilat,fhr 152 bpm,efw 265 g 95%,anatomy complete,no obvious abnormalities

## 2018-08-03 NOTE — Patient Instructions (Signed)
Mary [name], I greatly value your feedback.  If you receive a survey following your visit with [place] today, we appreciate you taking the time to fill it out.  Thanks, Knute Neu, CNM, [facility]  [place]!!! It is now [place] at [facility] ([place],  [address]) Entrance located off of [place] parking   Home Blood Pressure Monitoring for Patients   Your provider has recommended that you check your blood pressure (BP) at least once a week at home. If you do not have a blood pressure cuff at home, one will be provided for you. Contact your provider if you have not received your monitor within 1 week.   Helpful Tips for Accurate Home Blood Pressure Checks  . Don't smoke, exercise, or drink caffeine 30 minutes before checking your BP . Use the restroom before checking your BP (a full bladder can raise your pressure) . Relax in a comfortable upright chair . Feet on the ground . Left arm resting comfortably on a flat surface at the level of your heart . Legs uncrossed . Back supported . Sit quietly and don't talk . Place the cuff on your bare arm . Adjust snuggly, so that only two fingertips can fit between your skin and the top of the cuff . Check 2 readings separated by at least one minute . Keep a log of your BP readings . For a visual, please reference this diagram: http://ccnc.care/bpdiagram  Provider Name: [facility]     Phone: [phone]  Zone 1: ALL CLEAR  Continue to monitor your symptoms:  . BP reading is less than 140 (top number) or less than 90 (bottom number)  . No right upper stomach pain . No headaches or seeing spots . No feeling nauseated or throwing up . No swelling in face and hands  Zone 2: CAUTION Call your doctor's office for any of the following:  . BP reading is greater than 140 (top number) or greater than 90 (bottom number)  . Stomach pain under your ribs in the middle  or right side . Headaches or seeing spots . Feeling nauseated or throwing up . Swelling in face and hands  Zone 3: EMERGENCY  Seek immediate medical care if you have any of the following:  . BP reading is greater than160 (top number) or greater than 110 (bottom number) . Severe headaches not improving with Tylenol . Serious difficulty catching your breath . Any worsening symptoms from Zone 2      Second Trimester of Pregnancy The second trimester is from week 14 through week 27 (months 4 through 6). The second trimester is often a time when you feel your best. Your body has adjusted to being pregnant, and you begin to feel better physically. Usually, morning sickness has lessened or quit completely, you may have more energy, and you may have an increase in appetite. The second trimester is also a time when the fetus is growing rapidly. At the end of the sixth month, the fetus is about 9 inches long and weighs about 1 pounds. You will likely begin to feel the baby move (quickening) between 16 and 20 weeks of pregnancy. Body changes during your second trimester Your body continues to go through many changes during your second trimester. The changes vary from woman to woman.  Your weight will continue to increase. You will notice your lower abdomen bulging out.  You may begin to  get stretch marks on your hips, abdomen, and breasts.  You may develop headaches that can be relieved by medicines. The medicines should be approved by your health care provider.  You may urinate more often because the fetus is pressing on your bladder.  You may develop or continue to have heartburn as a result of your pregnancy.  You may develop constipation because certain hormones are causing the muscles that push waste through your intestines to slow down.  You may develop hemorrhoids or swollen, bulging veins (varicose veins).  You may have back pain. This is caused by: ? Weight gain. ? Pregnancy  hormones that are relaxing the joints in your pelvis. ? A shift in weight and the muscles that support your balance.  Your breasts will continue to grow and they will continue to become tender.  Your gums may bleed and may be sensitive to brushing and flossing.  Dark spots or blotches (chloasma, mask of pregnancy) may develop on your face. This will likely fade after the baby is born.  A dark line from your belly button to the pubic area (linea nigra) may appear. This will likely fade after the baby is born.  You may have changes in your hair. These can include thickening of your hair, rapid growth, and changes in texture. Some women also have hair loss during or after pregnancy, or hair that feels dry or thin. Your hair will most likely return to normal after your baby is born.  What to expect at prenatal visits During a routine prenatal visit:  You will be weighed to make sure you and the fetus are growing normally.  Your blood pressure will be taken.  Your abdomen will be measured to track your baby's growth.  The fetal heartbeat will be listened to.  Any test results from the previous visit will be discussed.  Your health care provider may ask you:  How you are feeling.  If you are feeling the baby move.  If you have had any abnormal symptoms, such as leaking fluid, bleeding, severe headaches, or abdominal cramping.  If you are using any tobacco products, including cigarettes, chewing tobacco, and electronic cigarettes.  If you have any questions.  Other tests that may be performed during your second trimester include:  Blood tests that check for: ? Low iron levels (anemia). ? High blood sugar that affects pregnant women (gestational diabetes) between 68 and 28 weeks. ? Rh antibodies. This is to check for a protein on red blood cells (Rh factor).  Urine tests to check for infections, diabetes, or protein in the urine.  An ultrasound to confirm the proper growth and  development of the baby.  An amniocentesis to check for possible genetic problems.  Fetal screens for spina bifida and Down syndrome.  HIV (human immunodeficiency virus) testing. Routine prenatal testing includes screening for HIV, unless you choose not to have this test.  Follow these instructions at home: Medicines  Follow your health care provider's instructions regarding medicine use. Specific medicines may be either safe or unsafe to take during pregnancy.  Take a prenatal vitamin that contains at least 600 micrograms (mcg) of folic acid.  If you develop constipation, try taking a stool softener if your health care provider approves. Eating and drinking  Eat a balanced diet that includes fresh fruits and vegetables, whole grains, good sources of protein such as meat, eggs, or tofu, and low-fat dairy. Your health care provider will help you determine the amount of weight gain  that is right for you.  Avoid raw meat and uncooked cheese. These carry germs that can cause birth defects in the baby.  If you have low calcium intake from food, talk to your health care provider about whether you should take a daily calcium supplement.  Limit foods that are high in fat and processed sugars, such as fried and sweet foods.  To prevent constipation: ? Drink enough fluid to keep your urine clear or pale yellow. ? Eat foods that are high in fiber, such as fresh fruits and vegetables, whole grains, and beans. Activity  Exercise only as directed by your health care provider. Most women can continue their usual exercise routine during pregnancy. Try to exercise for 30 minutes at least 5 days a week. Stop exercising if you experience uterine contractions.  Avoid heavy lifting, wear low heel shoes, and practice good posture.  A sexual relationship may be continued unless your health care provider directs you otherwise. Relieving pain and discomfort  Wear a good support bra to prevent discomfort  from breast tenderness.  Take warm sitz baths to soothe any pain or discomfort caused by hemorrhoids. Use hemorrhoid cream if your health care provider approves.  Rest with your legs elevated if you have leg cramps or low back pain.  If you develop varicose veins, wear support hose. Elevate your feet for 15 minutes, 3-4 times a day. Limit salt in your diet. Prenatal Care  Write down your questions. Take them to your prenatal visits.  Keep all your prenatal visits as told by your health care provider. This is important. Safety  Wear your seat belt at all times when driving.  Make a list of emergency phone numbers, including numbers for family, friends, the hospital, and police and fire departments. General instructions  Ask your health care provider for a referral to a local prenatal education class. Begin classes no later than the beginning of month 6 of your pregnancy.  Ask for help if you have counseling or nutritional needs during pregnancy. Your health care provider can offer advice or refer you to specialists for help with various needs.  Do not use hot tubs, steam rooms, or saunas.  Do not douche or use tampons or scented sanitary pads.  Do not cross your legs for long periods of time.  Avoid cat litter boxes and soil used by cats. These carry germs that can cause birth defects in the baby and possibly loss of the fetus by miscarriage or stillbirth.  Avoid all smoking, herbs, alcohol, and unprescribed drugs. Chemicals in these products can affect the formation and growth of the baby.  Do not use any products that contain nicotine or tobacco, such as cigarettes and e-cigarettes. If you need help quitting, ask your health care provider.  Visit your dentist if you have not gone yet during your pregnancy. Use a soft toothbrush to brush your teeth and be gentle when you floss. Contact a health care provider if:  You have dizziness.  You have mild pelvic cramps, pelvic  pressure, or nagging pain in the abdominal area.  You have persistent nausea, vomiting, or diarrhea.  You have a bad smelling vaginal discharge.  You have pain when you urinate. Get help right away if:  You have a fever.  You are leaking fluid from your vagina.  You have spotting or bleeding from your vagina.  You have severe abdominal cramping or pain.  You have rapid weight gain or weight loss.  You have shortness of  breath with chest pain.  You notice sudden or extreme swelling of your face, hands, ankles, feet, or legs.  You have not felt your baby move in over an hour.  You have severe headaches that do not go away when you take medicine.  You have vision changes. Summary  The second trimester is from week 14 through week 27 (months 4 through 6). It is also a time when the fetus is growing rapidly.  Your body goes through many changes during pregnancy. The changes vary from woman to woman.  Avoid all smoking, herbs, alcohol, and unprescribed drugs. These chemicals affect the formation and growth your baby.  Do not use any tobacco products, such as cigarettes, chewing tobacco, and e-cigarettes. If you need help quitting, ask your health care provider.  Contact your health care provider if you have any questions. Keep all prenatal visits as told by your health care provider. This is important. This information is not intended to replace advice given to you by your health care provider. Make sure you discuss any questions you have with your health care provider. Document Released: 01/21/2001 Document Revised: 07/05/2015 Document Reviewed: 03/30/2012 Elsevier Interactive Patient Education  2017 [employer]

## 2018-08-04 LAB — PMP SCREEN PROFILE (10S), URINE
Amphetamine Scrn, Ur: NEGATIVE ng/mL
BARBITURATE SCREEN URINE: NEGATIVE ng/mL
BENZODIAZEPINE SCREEN, URINE: NEGATIVE ng/mL
CANNABINOIDS UR QL SCN: NEGATIVE ng/mL
Cocaine (Metab) Scrn, Ur: NEGATIVE ng/mL
Creatinine(Crt), U: 106.1 mg/dL (ref 20.0–300.0)
Methadone Screen, Urine: NEGATIVE ng/mL
OXYCODONE+OXYMORPHONE UR QL SCN: NEGATIVE ng/mL
Opiate Scrn, Ur: NEGATIVE ng/mL
Ph of Urine: 7.9 (ref 4.5–8.9)
Phencyclidine Qn, Ur: NEGATIVE ng/mL
Propoxyphene Scrn, Ur: NEGATIVE ng/mL

## 2018-08-04 LAB — URINALYSIS, ROUTINE W REFLEX MICROSCOPIC
Bilirubin, UA: NEGATIVE
Glucose, UA: NEGATIVE
Ketones, UA: NEGATIVE
Leukocytes,UA: NEGATIVE
Nitrite, UA: NEGATIVE
Protein,UA: NEGATIVE
RBC, UA: NEGATIVE
Specific Gravity, UA: 1.018 (ref 1.005–1.030)
Urobilinogen, Ur: 1 mg/dL (ref 0.2–1.0)
pH, UA: 7.5 (ref 5.0–7.5)

## 2018-08-04 LAB — HEMOGLOBIN A1C
Est. average glucose Bld gHb Est-mCnc: 94 mg/dL
Hgb A1c MFr Bld: 4.9 % (ref 4.8–5.6)

## 2018-08-05 LAB — INTEGRATED 2
AFP MoM: 1.22
Alpha-Fetoprotein: 45.6 ng/mL
Crown Rump Length: 73.9 mm
DIA MoM: 0.86
DIA Value: 131.9 pg/mL
Estriol, Unconjugated: 2.27 ng/mL
Gest. Age on Collection Date: 13.3 weeks
Gestational Age: 18.3 weeks
Maternal Age at EDD: 31.5 yr
Nuchal Translucency (NT): 1.8 mm
Nuchal Translucency MoM: 1.06
Number of Fetuses: 1
PAPP-A MoM: 0.7
PAPP-A Value: 792.4 ng/mL
Test Results:: NEGATIVE
Weight: 175 [lb_av]
Weight: 175 [lb_av]
hCG MoM: 1.18
hCG Value: 27 IU/mL
uE3 MoM: 1.63

## 2018-08-05 LAB — CYTOLOGY - PAP
Chlamydia: NEGATIVE
Diagnosis: NEGATIVE
HPV: NOT DETECTED
Neisseria Gonorrhea: NEGATIVE

## 2018-08-06 LAB — URINE CULTURE

## 2018-08-31 ENCOUNTER — Ambulatory Visit (INDEPENDENT_AMBULATORY_CARE_PROVIDER_SITE_OTHER): Payer: 59 | Admitting: Women's Health

## 2018-08-31 ENCOUNTER — Encounter: Payer: Self-pay | Admitting: Women's Health

## 2018-08-31 ENCOUNTER — Other Ambulatory Visit: Payer: Self-pay

## 2018-08-31 VITALS — BP 111/70 | HR 75 | Wt 193.3 lb

## 2018-08-31 DIAGNOSIS — Z3482 Encounter for supervision of other normal pregnancy, second trimester: Secondary | ICD-10-CM

## 2018-08-31 DIAGNOSIS — Z3A21 21 weeks gestation of pregnancy: Secondary | ICD-10-CM

## 2018-08-31 NOTE — Progress Notes (Addendum)
   [place] VIRTUAL OBSTETRICS VISIT ENCOUNTER NOTE Patient name: Mary [name] MRN [id number]  Date of birth: 03-Sep-1985  I connected with patient on 08/31/18 at  2:45 PM EDT by [facility] and verified that I am speaking with the correct person using two identifiers. Due to COVID-19 recommendations, pt is not currently in our office.    I discussed the limitations, risks, security and privacy concerns of performing an evaluation and management service by telephone and the availability of in person appointments. I also discussed with the patient that there may be a patient responsible charge related to this service. The patient expressed understanding and agreed to proceed.  Chief Complaint:   Routine Prenatal Visit  History of Present Illness:   Mary [name] is a 33 y.o. [phone] female at [redacted]w[redacted]d with an Estimated Date of Delivery: 01/05/19 being evaluated today for ongoing management of a low-risk pregnancy.  Today she reports no complaints. Contractions: Not present. Vag. Bleeding: None.  Movement: Present. denies leaking of fluid. Review of Systems:   Pertinent items are noted in HPI Denies abnormal vaginal discharge w/ itching/odor/irritation, headaches, visual changes, shortness of breath, chest pain, abdominal pain, severe nausea/vomiting, or problems with urination or bowel movements unless otherwise stated above. Pertinent History Reviewed:  Reviewed past medical,surgical, social, obstetrical and family history.  Reviewed problem list, medications and allergies. Physical Assessment:   Vitals:   08/31/18 1357  BP: 111/70  Pulse: 75  Weight: 193 lb 4.8 oz (87.7 kg)  Body mass index is 33.18 kg/m.        Physical Examination:   General:  Alert, oriented and cooperative.   Mental Status: Normal mood and affect perceived. Normal judgment and thought content.  Rest of physical exam deferred due to type of encounter  No results found for this or any previous visit (from the past 24  hour(s)).  Assessment & Plan:  1) Pregnancy E0P2330 at [redacted]w[redacted]d with an Estimated Date of Delivery: 01/05/19    Meds: No orders of the defined types were placed in this encounter.  Labs/procedures today: none  Plan:  Continue routine obstetrical care.  Has home bp cuff.  Check bp weekly, let us know if >140/90.   Reviewed: Preterm labor symptoms and general obstetric precautions including but not limited to vaginal bleeding, contractions, leaking of fluid and fetal movement were reviewed in detail with the patient. The patient was advised to call back or seek an in-person office evaluation/go to [facility] at [facility] for any urgent or concerning symptoms. All questions were answered. Please refer to After Visit Summary for other counseling recommendations.    I provided 10 minutes of non-face-to-face time during this encounter.  Follow-up: Return in about 5 weeks (around 10/06/2018) for LROB, in person, PN2.  No orders of the defined types were placed in this encounter.  [place], [facility] 08/31/2018 2:13 PM

## 2018-08-31 NOTE — Patient Instructions (Signed)
Mary PollockAmanda C [name], I greatly value your feedback.  If you receive a survey following your visit with us today, we appreciate you taking the time to fill it out.  Thanks, Joellyn HaffKim , CNM, WHNP-BC   You will have your sugar test next visit.  Please do not eat or drink anything after midnight the night before you come, not even water.  You will be here for at least two hours.     [facility] is now [facility] ([address] [place], [place] [address]) Entrance located off of [address] Free 24/7 valet parking  Go to [email or web address] to register for FREE online childbirth classes   Call the office [phone]) or go to [facility] if:  You begin to have strong, frequent contractions  Your water breaks.  Sometimes it is a big gush of fluid, sometimes it is just a trickle that keeps getting your panties wet or running down your legs  You have vaginal bleeding.  It is normal to have a small amount of spotting if your cervix was checked.   You don't feel your baby moving like normal.  If you don't, get you something to eat and drink and lay down and focus on feeling your baby move.   If your baby is still not moving like normal, you should call the office or go to [facility].  [place] Pediatricians/Family Doctors:  [facility] [phone]            [facility] [phone]                 [facility] [phone] (usually not accepting new patients unless you have family there already, you are always welcome to call and ask)       [facility] [phone]       [facility]/Family [facility]:   [facility]: [phone]  [facility]: [phone]  [facility]: [phone]  [facility]:   [facility]: [phone]   [facility]: [phone]  [facility]  Family Doctors:  [facility]: [phone]   Home Blood Pressure Monitoring for Patients   Your provider has recommended that you check your blood pressure (BP) at least once a week at home. If you do not have a blood pressure cuff at home, one will be provided for you. Contact your provider if you have not received your monitor within 1 week.   Helpful Tips for Accurate Home Blood Pressure Checks  . Don't smoke, exercise, or drink caffeine 30 minutes before checking your BP . Use the restroom before checking your BP (a full bladder can raise your pressure) . Relax in a comfortable upright chair . Feet on the ground . Left arm resting comfortably on a flat surface at the level of your heart . Legs uncrossed . Back supported . Sit quietly and don't talk . Place the cuff on your bare arm . Adjust snuggly, so that only two fingertips can fit between your skin and the top of the cuff . Check 2 readings separated by at least one minute . Keep a log of your BP readings . For a visual, please reference this diagram: http://ccnc.care/bpdiagram  Provider Name: [facility]     Phone: [phone]  Zone 1: ALL CLEAR  Continue to monitor your symptoms:  . BP reading is less than 140 (top number) or less than 90 (bottom number)  .  No right upper stomach pain . No headaches or seeing spots . No feeling nauseated or throwing up . No swelling in face and hands  Zone 2: CAUTION Call your doctor's office for any of the following:  . BP reading is greater than 140 (top number) or greater than 90 (bottom number)  . Stomach pain under your ribs in the middle or right side . Headaches or seeing spots . Feeling nauseated or throwing up . Swelling in face and hands  Zone 3: EMERGENCY  Seek immediate medical care if you have any of the following:  . BP reading is greater than160 (top number) or greater than 110 (bottom number) . Severe headaches not improving with  Tylenol . Serious difficulty catching your breath . Any worsening symptoms from Zone 2   Second Trimester of Pregnancy The second trimester is from week 13 through week 28, months 4 through 6. The second trimester is often a time when you feel your best. Your body has also adjusted to being pregnant, and you begin to feel better physically. Usually, morning sickness has lessened or quit completely, you may have more energy, and you may have an increase in appetite. The second trimester is also a time when the fetus is growing rapidly. At the end of the sixth month, the fetus is about 9 inches long and weighs about 1 pounds. You will likely begin to feel the baby move (quickening) between 18 and 20 weeks of the pregnancy. BODY CHANGES Your body goes through many changes during pregnancy. The changes vary from woman to woman.   Your weight will continue to increase. You will notice your lower abdomen bulging out.  You may begin to get stretch marks on your hips, abdomen, and breasts.  You may develop headaches that can be relieved by medicines approved by your health care provider.  You may urinate more often because the fetus is pressing on your bladder.  You may develop or continue to have heartburn as a result of your pregnancy.  You may develop constipation because certain hormones are causing the muscles that push waste through your intestines to slow down.  You may develop hemorrhoids or swollen, bulging veins (varicose veins).  You may have back pain because of the weight gain and pregnancy hormones relaxing your joints between the bones in your pelvis and as a result of a shift in weight and the muscles that support your balance.  Your breasts will continue to grow and be tender.  Your gums may bleed and may be sensitive to brushing and flossing.  Dark spots or blotches (chloasma, mask of pregnancy) may develop on your face. This will likely fade after the baby is born.  A dark  line from your belly button to the pubic area (linea nigra) may appear. This will likely fade after the baby is born.  You may have changes in your hair. These can include thickening of your hair, rapid growth, and changes in texture. Some women also have hair loss during or after pregnancy, or hair that feels dry or thin. Your hair will most likely return to normal after your baby is born. WHAT TO EXPECT AT YOUR PRENATAL VISITS During a routine prenatal visit:  You will be weighed to make sure you and the fetus are growing normally.  Your blood pressure will be taken.  Your abdomen will be measured to track your baby's growth.  The fetal heartbeat will be listened to.  Any test results from the  previous visit will be discussed. Your health care provider may ask you:  How you are feeling.  If you are feeling the baby move.  If you have had any abnormal symptoms, such as leaking fluid, bleeding, severe headaches, or abdominal cramping.  If you have any questions. Other tests that may be performed during your second trimester include:  Blood tests that check for:  Low iron levels (anemia).  Gestational diabetes (between 24 and 28 weeks).  Rh antibodies.  Urine tests to check for infections, diabetes, or protein in the urine.  An ultrasound to confirm the proper growth and development of the baby.  An amniocentesis to check for possible genetic problems.  Fetal screens for spina bifida and Down syndrome. HOME CARE INSTRUCTIONS   Avoid all smoking, herbs, alcohol, and unprescribed drugs. These chemicals affect the formation and growth of the baby.  Follow your health care provider's instructions regarding medicine use. There are medicines that are either safe or unsafe to take during pregnancy.  Exercise only as directed by your health care provider. Experiencing uterine cramps is a good sign to stop exercising.  Continue to eat regular, healthy meals.  Wear a good  support bra for breast tenderness.  Do not use hot tubs, steam rooms, or saunas.  Wear your seat belt at all times when driving.  Avoid raw meat, uncooked cheese, cat litter boxes, and soil used by cats. These carry germs that can cause birth defects in the baby.  Take your prenatal vitamins.  Try taking a stool softener (if your health care provider approves) if you develop constipation. Eat more high-fiber foods, such as fresh vegetables or fruit and whole grains. Drink plenty of fluids to keep your urine clear or pale yellow.  Take warm sitz baths to soothe any pain or discomfort caused by hemorrhoids. Use hemorrhoid cream if your health care provider approves.  If you develop varicose veins, wear support hose. Elevate your feet for 15 minutes, 3-4 times a day. Limit salt in your diet.  Avoid heavy lifting, wear low heel shoes, and practice good posture.  Rest with your legs elevated if you have leg cramps or low back pain.  Visit your dentist if you have not gone yet during your pregnancy. Use a soft toothbrush to brush your teeth and be gentle when you floss.  A sexual relationship may be continued unless your health care provider directs you otherwise.  Continue to go to all your prenatal visits as directed by your health care provider. SEEK MEDICAL CARE IF:   You have dizziness.  You have mild pelvic cramps, pelvic pressure, or nagging pain in the abdominal area.  You have persistent nausea, vomiting, or diarrhea.  You have a bad smelling vaginal discharge.  You have pain with urination. SEEK IMMEDIATE MEDICAL CARE IF:   You have a fever.  You are leaking fluid from your vagina.  You have spotting or bleeding from your vagina.  You have severe abdominal cramping or pain.  You have rapid weight gain or loss.  You have shortness of breath with chest pain.  You notice sudden or extreme swelling of your face, hands, ankles, feet, or legs.  You have not felt your  baby move in over an hour.  You have severe headaches that do not go away with medicine.  You have vision changes. Document Released: 01/21/2001 Document Revised: 02/01/2013 Document Reviewed: 03/30/2012 [facility] Patient Information 2015 [place], [place]. This information is not intended to replace advice given  to you by your health care provider. Make sure you discuss any questions you have with your health care provider.   .Marland Kitchen

## 2018-09-14 DIAGNOSIS — Z20828 Contact with and (suspected) exposure to other viral communicable diseases: Secondary | ICD-10-CM | POA: Diagnosis not present

## 2018-10-06 ENCOUNTER — Encounter: Payer: Self-pay | Admitting: Obstetrics and Gynecology

## 2018-10-06 ENCOUNTER — Other Ambulatory Visit: Payer: 59

## 2018-10-06 ENCOUNTER — Other Ambulatory Visit: Payer: Self-pay

## 2018-10-06 ENCOUNTER — Ambulatory Visit (INDEPENDENT_AMBULATORY_CARE_PROVIDER_SITE_OTHER): Payer: 59 | Admitting: Obstetrics and Gynecology

## 2018-10-06 VITALS — BP 100/68 | HR 88 | Wt 202.6 lb

## 2018-10-06 DIAGNOSIS — Z3482 Encounter for supervision of other normal pregnancy, second trimester: Secondary | ICD-10-CM | POA: Diagnosis not present

## 2018-10-06 DIAGNOSIS — Z23 Encounter for immunization: Secondary | ICD-10-CM | POA: Diagnosis not present

## 2018-10-06 DIAGNOSIS — Z3A26 26 weeks gestation of pregnancy: Secondary | ICD-10-CM | POA: Diagnosis not present

## 2018-10-06 DIAGNOSIS — Z3A27 27 weeks gestation of pregnancy: Secondary | ICD-10-CM

## 2018-10-06 DIAGNOSIS — Z331 Pregnant state, incidental: Secondary | ICD-10-CM

## 2018-10-06 DIAGNOSIS — Z87440 Personal history of urinary (tract) infections: Secondary | ICD-10-CM | POA: Diagnosis not present

## 2018-10-06 DIAGNOSIS — Z1389 Encounter for screening for other disorder: Secondary | ICD-10-CM

## 2018-10-06 LAB — POCT URINALYSIS DIPSTICK OB
Blood, UA: NEGATIVE
Glucose, UA: NEGATIVE
Ketones, UA: NEGATIVE
Leukocytes, UA: NEGATIVE
Nitrite, UA: NEGATIVE
POC,PROTEIN,UA: NEGATIVE

## 2018-10-06 NOTE — Progress Notes (Signed)
Patient ID: Mary [name], female   DOB: 03-15-1985, 33 y.o.   MRN: [id number]    LOW-RISK PREGNANCY VISIT Patient name: Mary [name] MRN [id number]  Date of birth: 01/09/1986 Chief Complaint:   Routine Prenatal Visit (PN2)  History of Present Illness:   Mary [name] is a 33 y.o. [phone] female at [redacted]w[redacted]d with an Estimated Date of Delivery: 01/05/19 being seen today for ongoing management of a low-risk pregnancy. Has weighed over 200 around 213 and lost weight down to 170 then gained weight back right before getting pregnancy. HSV-2 seropositive. Hopes to have her tubes tied after this pregnancy. Wants baby circumcised in hospital. Today she reports no complaints. Contractions: Not present. Vag. Bleeding: None.  Movement: Present. denies leaking of fluid. Review of Systems:   Pertinent items are noted in HPI Denies abnormal vaginal discharge w/ itching/odor/irritation, headaches, visual changes, shortness of breath, chest pain, abdominal pain, severe nausea/vomiting, or problems with urination or bowel movements unless otherwise stated above. Pertinent History Reviewed:  Reviewed past medical,surgical, social, obstetrical and family history.  Reviewed problem list, medications and allergies. Physical Assessment:   Vitals:   10/06/18 0909  BP: 100/68  Pulse: 88  Weight: 202 lb 9.6 oz (91.9 kg)  Body mass index is 34.78 kg/m.        Physical Examination:   General appearance: Well appearing, and in no distress  Mental status: Alert, oriented to person, place, and time  Skin: Warm & dry  Cardiovascular: Normal heart rate noted  Respiratory: Normal respiratory effort, no distress  Abdomen: Soft, gravid, nontender  Pelvic: Cervical exam deferred         Extremities: Edema: Trace   Discussion: 1.Discussed with pt risks and benefits of BTL. Discussed using clips only vs bilateral salpingectomy. Advised pt that bilateral salpingectomy is a permanent procedure, but reduces the risk of  cancer by 2/3.  At end of discussion, pt had opportunity to ask questions and has no further questions at this time. Pt believes she will wait 6 months but will talk with husband before making a final decision.  Specific discussion of permanent sterilization as noted above. Greater than 50% was spent in counseling and coordination of care with the patient.   Total time greater than: 10 minutes.     Fetal Status: Fetal Heart Rate (bpm): 150 Fundal Height: 27 cm Movement: Present    Results for orders placed or performed in visit on 10/06/18 (from the past 24 hour(s))  POC Urinalysis Dipstick OB   Collection Time: 10/06/18  9:04 AM  Result Value Ref Range   Color, UA     Clarity, UA     Glucose, UA Negative Negative   Bilirubin, UA     Ketones, UA neg    Spec Grav, UA     Blood, UA neg    pH, UA     POC,PROTEIN,UA Negative Negative, Trace, Small (1+), Moderate (2+), Large (3+), 4+   Urobilinogen, UA     Nitrite, UA neg    Leukocytes, UA Negative Negative   Appearance     Odor      Assessment & Plan:  1) Low-risk pregnancy [id number] at [redacted]w[redacted]d with an Estimated Date of Delivery: 01/05/19   2) Tubal discussion,   3) HSV-2 seropositive   Meds: No orders of the defined types were placed in this encounter.  Labs/procedures today: PN2, T-dap vaccine  Plan:  Continue routine obstetrical care  Reviewed: Preterm labor symptoms and general obstetric precautions  including but not limited to vaginal bleeding, contractions, leaking of fluid and fetal movement were reviewed in detail with the patient.    Follow-up: Return in about 4 weeks (around 11/03/2018).  Orders Placed This Encounter  Procedures  . Urine Culture  . Tdap vaccine greater than or equal to 7yo IM  . POC Urinalysis Dipstick OB   By signing my name below, I, Arnette NorrisMari Johnson, attest that this documentation has been prepared under the direction and in the presence of Tilda BurrowFerguson, Ladarien Beeks V, MD. Electronically Signed: Arnette NorrisMari  Johnson Medical Scribe. 10/06/18. 9:33 AM.  I personally performed the services described in this documentation, which was SCRIBED in my presence. The recorded information has been reviewed and considered accurate. It has been edited as necessary during review. Tilda BurrowJohn V Mabell Esguerra, MD

## 2018-10-07 LAB — CBC
Hematocrit: 31.8 % — ABNORMAL LOW (ref 34.0–46.6)
Hemoglobin: 10.9 g/dL — ABNORMAL LOW (ref 11.1–15.9)
MCH: 31.1 pg (ref 26.6–33.0)
MCHC: 34.3 g/dL (ref 31.5–35.7)
MCV: 91 fL (ref 79–97)
Platelets: 238 10*3/uL (ref 150–450)
RBC: 3.51 x10E6/uL — ABNORMAL LOW (ref 3.77–5.28)
RDW: 11.8 % (ref 11.7–15.4)
WBC: 9.2 10*3/uL (ref 3.4–10.8)

## 2018-10-07 LAB — GLUCOSE TOLERANCE, 2 HOURS W/ 1HR
Glucose, 1 hour: 141 mg/dL (ref 65–179)
Glucose, 2 hour: 131 mg/dL (ref 65–152)
Glucose, Fasting: 79 mg/dL (ref 65–91)

## 2018-10-07 LAB — HIV ANTIBODY (ROUTINE TESTING W REFLEX): HIV Screen 4th Generation wRfx: NONREACTIVE

## 2018-10-07 LAB — ANTIBODY SCREEN: Antibody Screen: NEGATIVE

## 2018-10-07 LAB — RPR: RPR Ser Ql: NONREACTIVE

## 2018-10-08 ENCOUNTER — Encounter: Payer: Self-pay | Admitting: *Deleted

## 2018-10-08 ENCOUNTER — Other Ambulatory Visit: Payer: Self-pay | Admitting: Obstetrics and Gynecology

## 2018-10-08 LAB — URINE CULTURE

## 2018-10-08 MED ORDER — NITROFURANTOIN MONOHYD MACRO 100 MG PO CAPS: 100.0000 mg | ORAL_CAPSULE | Freq: Two times a day (BID) | ORAL | 0 refills | Status: DC

## 2018-10-08 NOTE — Progress Notes (Unsigned)
Culture positive for E Coli, sensitivities pending, will start macrobid.

## 2018-11-03 ENCOUNTER — Telehealth (INDEPENDENT_AMBULATORY_CARE_PROVIDER_SITE_OTHER): Payer: 59 | Admitting: Advanced Practice Midwife

## 2018-11-03 ENCOUNTER — Encounter: Payer: Self-pay | Admitting: Advanced Practice Midwife

## 2018-11-03 VITALS — BP 102/68 | HR 93 | Ht 64.0 in | Wt 209.0 lb

## 2018-11-03 DIAGNOSIS — Z3A31 31 weeks gestation of pregnancy: Secondary | ICD-10-CM

## 2018-11-03 DIAGNOSIS — Z3483 Encounter for supervision of other normal pregnancy, third trimester: Secondary | ICD-10-CM

## 2018-11-03 DIAGNOSIS — Z302 Encounter for sterilization: Secondary | ICD-10-CM | POA: Insufficient documentation

## 2018-11-03 NOTE — Progress Notes (Signed)
   [place] VIRTUAL OBSTETRICS VISIT ENCOUNTER NOTE Patient name: Mary [name] MRN [id number]  Date of birth: 1985/07/31  I connected with patient on 11/03/18 at  8:30 AM EDT by MyChart and verified that I am speaking with the correct person using two identifiers. Due to COVID-19 recommendations, pt is not currently in our office.    I discussed the limitations, risks, security and privacy concerns of performing an evaluation and management service by telephone and the availability of in person appointments. I also discussed with the patient that there may be a patient responsible charge related to this service. The patient expressed understanding and agreed to proceed.  Chief Complaint:   Routine Prenatal Visit  History of Present Illness:   Mary [name] is a 33 y.o. [phone] female at 6w0dwith an Estimated Date of Delivery: 01/05/19 being evaluated today for ongoing management of a low-risk pregnancy.  Today she reports edema of BLE. Contractions: Irregular. Vag. Bleeding: None.  Movement: Present. denies leaking of fluid. Wants to use COCs initially, then plans for BTL at 6 mos. Review of Systems:   Pertinent items are noted in HPI Denies abnormal vaginal discharge w/ itching/odor/irritation, headaches, visual changes, shortness of breath, chest pain, abdominal pain, severe nausea/vomiting, or problems with urination or bowel movements unless otherwise stated above. Pertinent History Reviewed:  Reviewed past medical,surgical, social, obstetrical and family history.  Reviewed problem list, medications and allergies. Physical Assessment:   Vitals:   11/03/18 0841  BP: 102/68  Pulse: 93  Weight: 209 lb (94.8 kg)  Height: _0  (1.626 m)  Body mass index is 35.87 kg/m.        Physical Examination:   General:  Alert, oriented and cooperative.   Mental Status: Normal mood and affect perceived. Normal judgment and thought content.  Rest of physical exam deferred due to type of  encounter  No results found for this or any previous visit (from the past 24 hour(s)).  Assessment & Plan:  1) Pregnancy [id number] at 323w0dith an Estimated Date of Delivery: 01/05/19   2) Bilateral LE edema: requesting to wear compression socks to work- recommend putting on in the morning  3) HSV 2: start prophylactic Valtrex at 34-36wks  4) Rubella nonimmune: discussed getting MMR pp   Meds: No orders of the defined types were placed in this encounter.   Labs/procedures today: none  Plan:  Continue routine obstetrical care.  Has home bp cuff.  Check bp weekly, let usKoreanow if >140/90.   Reviewed: Preterm labor symptoms and general obstetric precautions including but not limited to vaginal bleeding, contractions, leaking of fluid and fetal movement were reviewed in detail with the patient. The patient was advised to call back or seek an in-person office evaluation/[facility] concerning [facility]. All questions were answered. Please refer to After Visit Summary for other counseling recommendations.    I provided 12 minutes of non-face-to-face time during this encounter.  Follow-up: Return in about 2 weeks (around 11/17/2018) [place] No orders of the defined types were placed in this encounter.  KiMyrtis SerNM 11/03/2018 9:06 AM

## 2018-11-19 ENCOUNTER — Ambulatory Visit (INDEPENDENT_AMBULATORY_CARE_PROVIDER_SITE_OTHER): Payer: 59 | Admitting: Obstetrics & Gynecology

## 2018-11-19 ENCOUNTER — Encounter: Payer: Self-pay | Admitting: Obstetrics & Gynecology

## 2018-11-19 ENCOUNTER — Other Ambulatory Visit: Payer: Self-pay

## 2018-11-19 VITALS — BP 102/70 | HR 78 | Wt 211.0 lb

## 2018-11-19 DIAGNOSIS — Z3483 Encounter for supervision of other normal pregnancy, third trimester: Secondary | ICD-10-CM

## 2018-11-19 DIAGNOSIS — Z3A33 33 weeks gestation of pregnancy: Secondary | ICD-10-CM

## 2018-11-19 DIAGNOSIS — Z331 Pregnant state, incidental: Secondary | ICD-10-CM

## 2018-11-19 DIAGNOSIS — Z1389 Encounter for screening for other disorder: Secondary | ICD-10-CM

## 2018-11-19 LAB — POCT URINALYSIS DIPSTICK OB
Blood, UA: NEGATIVE
Glucose, UA: NEGATIVE
Ketones, UA: NEGATIVE
Leukocytes, UA: NEGATIVE
Nitrite, UA: NEGATIVE
POC,PROTEIN,UA: NEGATIVE

## 2018-11-19 NOTE — Progress Notes (Signed)
   LOW-RISK PREGNANCY VISIT Patient name: Mary [name] MRN [id number]  Date of birth: 1985-11-05 Chief Complaint:   Routine Prenatal Visit  History of Present Illness:   Mary [name] is a 33 y.o. G24P1021 female at [redacted]w[redacted]d with an Estimated Date of Delivery: 01/05/19 being seen today for ongoing management of a low-risk pregnancy.  Today she reports no complaints. Contractions: Irregular.  .  Movement: Absent. denies leaking of fluid. Review of Systems:   Pertinent items are noted in HPI Denies abnormal vaginal discharge w/ itching/odor/irritation, headaches, visual changes, shortness of breath, chest pain, abdominal pain, severe nausea/vomiting, or problems with urination or bowel movements unless otherwise stated above. Pertinent History Reviewed:  Reviewed past medical,surgical, social, obstetrical and family history.  Reviewed problem list, medications and allergies. Physical Assessment:   Vitals:   11/19/18 1149  BP: 102/70  Pulse: 78  Weight: 211 lb (95.7 kg)  Body mass index is 36.22 kg/m.        Physical Examination:   General appearance: Well appearing, and in no distress  Mental status: Alert, oriented to person, place, and time  Skin: Warm & dry  Cardiovascular: Normal heart rate noted  Respiratory: Normal respiratory effort, no distress  Abdomen: Soft, gravid, nontender  Pelvic: Cervical exam deferred         Extremities: Edema: Trace  Fetal Status:     Movement: Absent    Results for orders placed or performed in visit on 11/19/18 (from the past 24 hour(s))  POC Urinalysis Dipstick OB   Collection Time: 11/19/18 11:56 AM  Result Value Ref Range   Color, UA     Clarity, UA     Glucose, UA Negative Negative   Bilirubin, UA     Ketones, UA neg    Spec Grav, UA     Blood, UA neg    pH, UA     POC,PROTEIN,UA Negative Negative, Trace, Small (1+), Moderate (2+), Large (3+), 4+   Urobilinogen, UA     Nitrite, UA neg    Leukocytes, UA Negative Negative   Appearance     Odor      Assessment & Plan:  1) Low-risk pregnancy [id number] at [redacted]w[redacted]d with an Estimated Date of Delivery: 01/05/19      Meds: No orders of the defined types were placed in this encounter.  Labs/procedures today:   Plan:  Continue routine obstetrical care   Reviewed: Preterm labor symptoms and general obstetric precautions including but not limited to vaginal bleeding, contractions, leaking of fluid and fetal movement were reviewed in detail with the patient.  All questions were answered.  home bp cuff. Rx faxed to . Check bp weekly, let us know if >140/90.   Follow-up: No follow-ups on file.  Orders Placed This Encounter  Procedures  . POC Urinalysis Dipstick OB   Mertie Clause Eure 11/19/2018 12:28 PM

## 2018-12-07 ENCOUNTER — Other Ambulatory Visit: Payer: Self-pay

## 2018-12-07 ENCOUNTER — Ambulatory Visit (INDEPENDENT_AMBULATORY_CARE_PROVIDER_SITE_OTHER): Payer: 59 | Admitting: Obstetrics & Gynecology

## 2018-12-07 VITALS — BP 132/87 | HR 93 | Wt 216.0 lb

## 2018-12-07 DIAGNOSIS — Z3483 Encounter for supervision of other normal pregnancy, third trimester: Secondary | ICD-10-CM

## 2018-12-07 DIAGNOSIS — N764 Abscess of vulva: Secondary | ICD-10-CM

## 2018-12-07 DIAGNOSIS — Z3A35 35 weeks gestation of pregnancy: Secondary | ICD-10-CM

## 2018-12-07 MED ORDER — SILVER SULFADIAZINE 1 % EX CREA: TOPICAL_CREAM | CUTANEOUS | 11 refills | Status: DC

## 2018-12-07 MED ORDER — SULFAMETHOXAZOLE-TRIMETHOPRIM 800-160 MG PO TABS: 1.0000 | ORAL_TABLET | Freq: Two times a day (BID) | ORAL | 0 refills | Status: DC

## 2018-12-07 NOTE — Progress Notes (Signed)
   LOW-RISK PREGNANCY VISIT Patient name: Mary [name] MRN [id number]  Date of birth: 10/21/1985 Chief Complaint:   Routine Prenatal Visit  History of Present Illness:   Mary [name] is a 33 y.o. G61P1021 female at [redacted]w[redacted]d with an Estimated Date of Delivery: 01/05/19 being seen today for ongoing management of a low-risk pregnancy.  Today she reports no complaints.except a boil that has drained for 2 days Contractions: Not present. Vag. Bleeding: None.  Movement: Present. denies leaking of fluid. Review of Systems:   Pertinent items are noted in HPI Denies abnormal vaginal discharge w/ itching/odor/irritation, headaches, visual changes, shortness of breath, chest pain, abdominal pain, severe nausea/vomiting, or problems with urination or bowel movements unless otherwise stated above. Pertinent History Reviewed:  Reviewed past medical,surgical, social, obstetrical and family history.  Reviewed problem list, medications and allergies. Physical Assessment:   Vitals:   12/07/18 1513  BP: 132/87  Pulse: 93  Weight: 216 lb (98 kg)  Body mass index is 37.08 kg/m.        Physical Examination:   General appearance: Well appearing, and in no distress  Mental status: Alert, oriented to person, place, and time  Skin: Warm & dry  Cardiovascular: Normal heart rate noted  Respiratory: Normal respiratory effort, no distress  Abdomen: Soft, gravid, nontender  Pelvic: Cervical exam deferred         Extremities: Edema: Trace  Fetal Status: Fetal Heart Rate (bpm): 139 Fundal Height: 37 cm Movement: Present    Chaperone: Madiha Rash    No results found for this or any previous visit (from the past 24 hour(s)).  Assessment & Plan:  1) Low-risk pregnancy [id number] at [redacted]w[redacted]d with an Estimated Date of Delivery: 01/05/19   2) Right vulvar infected hair follical/boil, Rx   Meds ordered this encounter  Medications  . sulfamethoxazole-trimethoprim (BACTRIM DS) 800-160 MG tablet    Sig: Take 1 tablet  by mouth 2 (two) times daily.    Dispense:  14 tablet    Refill:  0  . silver sulfADIAZINE (SILVADENE) 1 % cream    Sig: Use to area 3-4 times daily    Dispense:  50 g    Refill:  11      Meds:  Meds ordered this encounter  Medications  . sulfamethoxazole-trimethoprim (BACTRIM DS) 800-160 MG tablet    Sig: Take 1 tablet by mouth 2 (two) times daily.    Dispense:  14 tablet    Refill:  0  . silver sulfADIAZINE (SILVADENE) 1 % cream    Sig: Use to area 3-4 times daily    Dispense:  50 g    Refill:  11   Labs/procedures today:   Plan:  Continue routine obstetrical care  Next visit: prefers in person    Reviewed: Term labor symptoms and general obstetric precautions including but not limited to vaginal bleeding, contractions, leaking of fluid and fetal movement were reviewed in detail with the patient.  All questions were answered. Has home bp cuff. Rx faxed to . Check bp weekly, let us know if >140/90.   Follow-up: Return in about 1 week (around 12/14/2018) for [place].  No orders of the defined types were placed in this encounter.  Florian Buff  12/07/2018 3:49 PM

## 2018-12-10 ENCOUNTER — Encounter: Payer: 59 | Admitting: Obstetrics & Gynecology

## 2018-12-15 ENCOUNTER — Encounter: Payer: Self-pay | Admitting: Obstetrics and Gynecology

## 2018-12-15 ENCOUNTER — Ambulatory Visit (INDEPENDENT_AMBULATORY_CARE_PROVIDER_SITE_OTHER): Payer: 59 | Admitting: Obstetrics and Gynecology

## 2018-12-15 ENCOUNTER — Other Ambulatory Visit (HOSPITAL_COMMUNITY)
Admission: RE | Admit: 2018-12-15 | Discharge: 2018-12-15 | Disposition: A | Discharge status: Home / Self Care | Payer: 59 | Source: Ambulatory Visit | Attending: Obstetrics and Gynecology | Admitting: Obstetrics and Gynecology

## 2018-12-15 ENCOUNTER — Other Ambulatory Visit: Payer: Self-pay

## 2018-12-15 VITALS — BP 128/76 | HR 77 | Wt 217.2 lb

## 2018-12-15 DIAGNOSIS — Z87440 Personal history of urinary (tract) infections: Secondary | ICD-10-CM | POA: Diagnosis not present

## 2018-12-15 DIAGNOSIS — Z3483 Encounter for supervision of other normal pregnancy, third trimester: Secondary | ICD-10-CM

## 2018-12-15 DIAGNOSIS — R768 Other specified abnormal immunological findings in serum: Secondary | ICD-10-CM

## 2018-12-15 DIAGNOSIS — Z331 Pregnant state, incidental: Secondary | ICD-10-CM

## 2018-12-15 DIAGNOSIS — Z3A37 37 weeks gestation of pregnancy: Secondary | ICD-10-CM | POA: Diagnosis not present

## 2018-12-15 DIAGNOSIS — Z1389 Encounter for screening for other disorder: Secondary | ICD-10-CM

## 2018-12-15 DIAGNOSIS — Z2839 Other underimmunization status: Secondary | ICD-10-CM

## 2018-12-15 DIAGNOSIS — O99891 Other specified diseases and conditions complicating pregnancy: Secondary | ICD-10-CM

## 2018-12-15 DIAGNOSIS — Z029 Encounter for administrative examinations, unspecified: Secondary | ICD-10-CM

## 2018-12-15 DIAGNOSIS — Z283 Underimmunization status: Secondary | ICD-10-CM

## 2018-12-15 LAB — POCT URINALYSIS DIPSTICK OB
Glucose, UA: NEGATIVE
Ketones, UA: NEGATIVE
Nitrite, UA: NEGATIVE
POC,PROTEIN,UA: NEGATIVE

## 2018-12-15 MED ORDER — VALACYCLOVIR HCL 500 MG PO TABS: 500.0000 mg | ORAL_TABLET | Freq: Two times a day (BID) | ORAL | 6 refills | Status: DC

## 2018-12-15 NOTE — Patient Instructions (Signed)

## 2018-12-15 NOTE — Progress Notes (Signed)
Subjective:  Mary [name] is a 33 y.o. [phone] at [redacted]w[redacted]d being seen today for ongoing prenatal care.  She is currently monitored for the following issues for this low-risk pregnancy and has HSV-2 seropositive; Adjustment disorder with anxious mood; Pap smear of cervix shows high risk HPV present; Rubella non-immune status, antepartum; Supervision of normal pregnancy; UTI (urinary tract infection) during pregnancy, second trimester; and Request for sterilization on their problem list.  Patient reports no complaints.  Contractions: Irregular. Vag. Bleeding: None.  Movement: Present. Denies leaking of fluid.   The following portions of the patient's history were reviewed and updated as appropriate: allergies, current medications, past family history, past medical history, past social history, past surgical history and problem list. Problem list updated.  Objective:   Vitals:   12/15/18 0919  BP: 128/76  Pulse: 77  Weight: 217 lb 3.2 oz (98.5 kg)    Fetal Status:     Movement: Present     General:  Alert, oriented and cooperative. Patient is in no acute distress.  Skin: Skin is warm and dry. No rash noted.   Cardiovascular: Normal heart rate noted  Respiratory: Normal respiratory effort, no problems with respiration noted  Abdomen: Soft, gravid, appropriate for gestational age. Pain/Pressure: Present     Pelvic:  Cervical exam performed        Extremities: Normal range of motion.  Edema: Trace  Mental Status: Normal mood and affect. Normal behavior. Normal judgment and thought content.   Urinalysis:      Assessment and Plan:  Pregnancy: G4P1021 at [redacted]w[redacted]d  1. Encounter for supervision of other normal pregnancy in third trimester Labor precautions - Cervicovaginal ancillary only( [place]) - Strep Gp B NAA  2. Screening for genitourinary condition  - POC Urinalysis Dipstick OB  3. Pregnant state, incidental  - POC Urinalysis Dipstick OB  4. [redacted] weeks gestation of  pregnancy  - Cervicovaginal ancillary only( [place]) - Strep Gp B NAA  5. HSV-2 seropositive  - valACYclovir (VALTREX) 500 MG tablet; Take 1 tablet (500 mg total) by mouth 2 (two) times daily.  Dispense: 60 tablet; Refill: 6  6. Rubella non-immune status, antepartum Vaccine PP  Term labor symptoms and general obstetric precautions including but not limited to vaginal bleeding, contractions, leaking of fluid and fetal movement were reviewed in detail with the patient. Please refer to After Visit Summary for other counseling recommendations.  Return in about 2 weeks (around 12/29/2018) for OB visit, face to face.   Chancy Milroy, MD

## 2018-12-15 NOTE — Addendum Note (Signed)
Addended by: Linton Rump on: 12/15/2018 12:34 PM   Modules accepted: Orders

## 2018-12-16 LAB — CERVICOVAGINAL ANCILLARY ONLY
Chlamydia: NEGATIVE
Comment: NEGATIVE
Comment: NORMAL
Neisseria Gonorrhea: NEGATIVE

## 2018-12-17 LAB — URINE CULTURE

## 2018-12-17 LAB — STREP GP B NAA: Strep Gp B NAA: POSITIVE — AB

## 2018-12-26 ENCOUNTER — Inpatient Hospital Stay (HOSPITAL_COMMUNITY)
Admission: AD | Admit: 2018-12-26 | Discharge: 2018-12-26 | Disposition: A | Discharge status: Home / Self Care | Payer: 59 | Attending: Obstetrics & Gynecology | Admitting: Obstetrics & Gynecology

## 2018-12-26 ENCOUNTER — Encounter (HOSPITAL_COMMUNITY): Payer: Self-pay

## 2018-12-26 ENCOUNTER — Other Ambulatory Visit: Payer: Self-pay

## 2018-12-26 DIAGNOSIS — N898 Other specified noninflammatory disorders of vagina: Secondary | ICD-10-CM | POA: Diagnosis not present

## 2018-12-26 DIAGNOSIS — Z3493 Encounter for supervision of normal pregnancy, unspecified, third trimester: Secondary | ICD-10-CM | POA: Diagnosis not present

## 2018-12-26 DIAGNOSIS — O26893 Other specified pregnancy related conditions, third trimester: Secondary | ICD-10-CM | POA: Insufficient documentation

## 2018-12-26 DIAGNOSIS — Z3A38 38 weeks gestation of pregnancy: Secondary | ICD-10-CM | POA: Insufficient documentation

## 2018-12-26 LAB — WET PREP, GENITAL
Sperm: NONE SEEN
Trich, Wet Prep: NONE SEEN
Yeast Wet Prep HPF POC: NONE SEEN

## 2018-12-26 LAB — POCT FERN TEST: POCT Fern Test: NEGATIVE

## 2018-12-26 LAB — AMNISURE RUPTURE OF MEMBRANE (ROM) NOT AT ARMC: Amnisure ROM: NEGATIVE

## 2018-12-26 NOTE — MAU Provider Note (Signed)
None      S: Ms. LUTISHA KNOCHE is a 33 y.o. [phone] at [redacted]w[redacted]d  who presents to MAU today complaining of leaking of fluid intermittently since last night. She denies vaginal bleeding. She endorses contractions. She reports normal fetal movement.    O: BP 130/81 (BP Location: Right Arm)   Pulse 92   Temp 98.1 F (36.7 C) (Oral)   Resp 16   Ht 5\' 4"  (1.626 m)   Wt 101.1 kg   LMP 03/31/2018   SpO2 100%   BMI 38.24 kg/m  GENERAL: Well-developed, well-nourished female in no acute distress.  HEAD: Normocephalic, atraumatic.  CHEST: Normal effort of breathing, regular heart rate ABDOMEN: Soft, nontender, gravid PELVIC: Normal external female genitalia. Vagina is pink and rugated. Cervix with normal contour, no lesions. Normal discharge.  negative pooling.   Cervical exam:  Dilation: 4 Effacement (%): 50 Station: -2 Presentation: Vertex Exam by:: Leftwich Kirby CNM   Fetal Monitoring: Baseline: 135 Variability: moderate Accelerations: present Decelerations: none Contractions: Q 4-6 minutes, mild to palpation  Results for orders placed or performed during the hospital encounter of 12/26/18 (from the past 24 hour(s))  Wet prep, genital     Status: Abnormal   Collection Time: 12/26/18  6:57 PM  Result Value Ref Range   Yeast Wet Prep HPF POC NONE SEEN NONE SEEN   Trich, Wet Prep NONE SEEN NONE SEEN   Clue Cells Wet Prep HPF POC PRESENT (A) NONE SEEN   WBC, Wet Prep HPF POC FEW (A) NONE SEEN   Sperm NONE SEEN   Fern Test     Status: None   Collection Time: 12/26/18  7:15 PM  Result Value Ref Range   POCT Fern Test Negative = intact amniotic membranes   Amnisure rupture of membrane (rom)     Status: None   Collection Time: 12/26/18  7:28 PM  Result Value Ref Range   Amnisure ROM NEGATIVE    MDM:  Pt reports leaking fluid down her legs and soaking a pad of fluid. No leakage in MAU, no pooling during SSE, and amnisure is negative. No evidence of ROM, may have been vaginal  discharge or urine.  Cervix 4/50 with irregular contractions.  RN to recheck cervix in 1-2 hours.  A: [facility] at [redacted]w[redacted]d  Membranes intact  P: RN to reevaluate for labor  Elvera Maria, CNM 12/26/2018 8:23 PM

## 2018-12-26 NOTE — MAU Note (Signed)
Pt states she was walking and then she felt pressure and then had a large gush of clear fluid and has had some trickling since. She said she also felt a little gush last night around 2130. She states she has had some contractions last night that went away and has had them every 10 minutes since she felt the gush of water this evening at 1700. Denies VB, reports good fetal movement.

## 2018-12-26 NOTE — Discharge Instructions (Signed)

## 2018-12-26 NOTE — MAU Note (Signed)
Urine in lab 

## 2018-12-28 ENCOUNTER — Encounter: Payer: Self-pay | Admitting: Family Medicine

## 2018-12-29 ENCOUNTER — Ambulatory Visit (INDEPENDENT_AMBULATORY_CARE_PROVIDER_SITE_OTHER): Payer: 59 | Admitting: Advanced Practice Midwife

## 2018-12-29 ENCOUNTER — Other Ambulatory Visit: Payer: Self-pay

## 2018-12-29 VITALS — BP 118/83 | HR 73 | Wt 219.0 lb

## 2018-12-29 DIAGNOSIS — B951 Streptococcus, group B, as the cause of diseases classified elsewhere: Secondary | ICD-10-CM | POA: Insufficient documentation

## 2018-12-29 DIAGNOSIS — Z3A39 39 weeks gestation of pregnancy: Secondary | ICD-10-CM

## 2018-12-29 DIAGNOSIS — Z331 Pregnant state, incidental: Secondary | ICD-10-CM

## 2018-12-29 DIAGNOSIS — Z3483 Encounter for supervision of other normal pregnancy, third trimester: Secondary | ICD-10-CM

## 2018-12-29 DIAGNOSIS — Z1389 Encounter for screening for other disorder: Secondary | ICD-10-CM

## 2018-12-29 LAB — POCT URINALYSIS DIPSTICK OB
Blood, UA: NEGATIVE
Glucose, UA: NEGATIVE
Ketones, UA: NEGATIVE
Leukocytes, UA: NEGATIVE
Nitrite, UA: NEGATIVE
POC,PROTEIN,UA: NEGATIVE

## 2018-12-29 NOTE — Patient Instructions (Signed)
Francesca Jewett, I greatly value your feedback.  If you receive a survey following your visit with [place] today, we appreciate you taking the time to fill it out.  Thanks, Derrill Memo, [place]!!! It is now [facility] at [facility] ([place], [place] [id number]) Entrance located off of [address] parking    Go to [email or web address] to register for FREE online childbirth classes   Call the office [phone]) or go to [facility] if:  You begin to have strong, frequent contractions  Your water breaks.  Sometimes it is a big gush of fluid, sometimes it is just a trickle that keeps getting your panties wet or running down your legs  You have vaginal bleeding.  It is normal to have a small amount of spotting if your cervix was checked.   You don't feel your baby moving like normal.  If you don't, get you something to eat and drink and lay down and focus on feeling your baby move.  You should feel at least 10 movements in 2 hours.  If you don't, you should call the office or go to [facility].    Tdap Vaccine  It is recommended that you get the Tdap vaccine during the third trimester of EACH pregnancy to help protect your baby from getting pertussis (whooping cough)  27-36 weeks is the BEST time to do this so that you can pass the protection on to your baby. During pregnancy is better than after pregnancy, but if you are unable to get it during pregnancy it will be offered at the hospital.   You can get this vaccine with [place], at the health department, your family doctor, or some local pharmacies  Everyone who will be around your baby should also be up-to-date on their vaccines before the baby comes. Adults (who are not pregnant) only need 1 dose of Tdap during adulthood.   [place] Pediatricians/Family Doctors:  [facility] [phone]                  [place] [phone] (usually not accepting new patients unless you have family there already, you are always welcome to call and ask)       [facility] Department [phone]       [facility] Pediatricians/Family Doctors:   [facility]: [phone]  [facility]: [phone]  [facility]: [place] Doctors:   [facility]: [facility]: [place]:  [place]: [phone]   Home Blood Pressure Monitoring for Patients   Your provider has recommended that you check your blood pressure (BP) at least once a week at home. If you do not have a blood pressure cuff at home, one will be provided for you. Contact your provider if you have not received your monitor within 1 week.   Helpful Tips for Accurate Home Blood Pressure Checks  . Don't smoke, exercise, or drink caffeine 30 minutes before checking your BP . Use the restroom before checking your BP (a full bladder can raise your pressure) . Relax in a comfortable upright chair . Feet on the ground . Left arm resting comfortably on a flat surface at the level of your heart . Legs uncrossed . Back supported . Sit quietly and don't talk .  Place the cuff on your bare arm . Adjust snuggly, so that only two fingertips can fit between your skin and the top of the cuff . Check 2 readings separated by at least one minute . Keep a log of your BP readings . For a visual, please reference this diagram: http://ccnc.care/bpdiagram  Provider Name: [facility]     Phone: [phone]  Zone 1: ALL CLEAR  Continue to monitor your symptoms:  . BP reading is less than 140 (top number) or less than 90 (bottom number)  . No right upper stomach pain . No headaches or seeing spots . No feeling nauseated or throwing up . No swelling in face and  hands  Zone 2: CAUTION Call your doctor's office for any of the following:  . BP reading is greater than 140 (top number) or greater than 90 (bottom number)  . Stomach pain under your ribs in the middle or right side . Headaches or seeing spots . Feeling nauseated or throwing up . Swelling in face and hands  Zone 3: EMERGENCY  Seek immediate medical care if you have any of the following:  . BP reading is greater than160 (top number) or greater than 110 (bottom number) . Severe headaches not improving with Tylenol . Serious difficulty catching your breath . Any worsening symptoms from Zone 2   Third Trimester of Pregnancy The third trimester is from week 29 through week 42, months 7 through 9. The third trimester is a time when the fetus is growing rapidly. At the end of the ninth month, the fetus is about 20 inches in length and weighs 6-10 pounds.  BODY CHANGES Your body goes through many changes during pregnancy. The changes vary from woman to woman.   Your weight will continue to increase. You can expect to gain 25-35 pounds (11-16 kg) by the end of the pregnancy.  You may begin to get stretch marks on your hips, abdomen, and breasts.  You may urinate more often because the fetus is moving lower into your pelvis and pressing on your bladder.  You may develop or continue to have heartburn as a result of your pregnancy.  You may develop constipation because certain hormones are causing the muscles that push waste through your intestines to slow down.  You may develop hemorrhoids or swollen, bulging veins (varicose veins).  You may have pelvic pain because of the weight gain and pregnancy hormones relaxing your joints between the bones in your pelvis. Backaches may result from overexertion of the muscles supporting your posture.  You may have changes in your hair. These can include thickening of your hair, rapid growth, and changes in texture. Some women also have hair loss during  or after pregnancy, or hair that feels dry or thin. Your hair will most likely return to normal after your baby is born.  Your breasts will continue to grow and be tender. A yellow discharge may leak from your breasts called colostrum.  Your belly button may stick out.  You may feel short of breath because of your expanding uterus.  You may notice the fetus "dropping," or moving lower in your abdomen.  You may have a bloody mucus discharge. This usually occurs a few days to a week before labor begins.  Your cervix becomes thin and soft (effaced) near your due date. WHAT TO EXPECT AT YOUR PRENATAL EXAMS  You will have prenatal exams every 2 weeks until week 36. Then, you will have weekly prenatal exams. During  a routine prenatal visit:  You will be weighed to make sure you and the fetus are growing normally.  Your blood pressure is taken.  Your abdomen will be measured to track your baby's growth.  The fetal heartbeat will be listened to.  Any test results from the previous visit will be discussed.  You may have a cervical check near your due date to see if you have effaced. At around 36 weeks, your caregiver will check your cervix. At the same time, your caregiver will also perform a test on the secretions of the vaginal tissue. This test is to determine if a type of bacteria, Group B streptococcus, is present. Your caregiver will explain this further. Your caregiver may ask you:  What your birth plan is.  How you are feeling.  If you are feeling the baby move.  If you have had any abnormal symptoms, such as leaking fluid, bleeding, severe headaches, or abdominal cramping.  If you have any questions. Other tests or screenings that may be performed during your third trimester include:  Blood tests that check for low iron levels (anemia).  Fetal testing to check the health, activity level, and growth of the fetus. Testing is done if you have certain medical conditions or if  there are problems during the pregnancy. FALSE LABOR You may feel small, irregular contractions that eventually go away. These are called Braxton Hicks contractions, or false labor. Contractions may last for hours, days, or even weeks before true labor sets in. If contractions come at regular intervals, intensify, or become painful, it is best to be seen by your caregiver.  SIGNS OF LABOR   Menstrual-like cramps.  Contractions that are 5 minutes apart or less.  Contractions that start on the top of the uterus and spread down to the lower abdomen and back.  A sense of increased pelvic pressure or back pain.  A watery or bloody mucus discharge that comes from the vagina. If you have any of these signs before the 37th week of pregnancy, call your caregiver right away. You need to go to the hospital to get checked immediately. HOME CARE INSTRUCTIONS   Avoid all smoking, herbs, alcohol, and unprescribed drugs. These chemicals affect the formation and growth of the baby.  Follow your caregiver's instructions regarding medicine use. There are medicines that are either safe or unsafe to take during pregnancy.  Exercise only as directed by your caregiver. Experiencing uterine cramps is a good sign to stop exercising.  Continue to eat regular, healthy meals.  Wear a good support bra for breast tenderness.  Do not use hot tubs, steam rooms, or saunas.  Wear your seat belt at all times when driving.  Avoid raw meat, uncooked cheese, cat litter boxes, and soil used by cats. These carry germs that can cause birth defects in the baby.  Take your prenatal vitamins.  Try taking a stool softener (if your caregiver approves) if you develop constipation. Eat more high-fiber foods, such as fresh vegetables or fruit and whole grains. Drink plenty of fluids to keep your urine clear or pale yellow.  Take warm sitz baths to soothe any pain or discomfort caused by hemorrhoids. Use hemorrhoid cream if your  caregiver approves.  If you develop varicose veins, wear support hose. Elevate your feet for 15 minutes, 3-4 times a day. Limit salt in your diet.  Avoid heavy lifting, wear low heal shoes, and practice good posture.  Rest a lot with your legs elevated if you  have leg cramps or low back pain.  Visit your dentist if you have not gone during your pregnancy. Use a soft toothbrush to brush your teeth and be gentle when you floss.  A sexual relationship may be continued unless your caregiver directs you otherwise.  Do not travel far distances unless it is absolutely necessary and only with the approval of your caregiver.  Take prenatal classes to understand, practice, and ask questions about the labor and delivery.  Make a trial run to the hospital.  Pack your hospital bag.  Prepare the baby's nursery.  Continue to go to all your prenatal visits as directed by your caregiver. SEEK MEDICAL CARE IF:  You are unsure if you are in labor or if your water has broken.  You have dizziness.  You have mild pelvic cramps, pelvic pressure, or nagging pain in your abdominal area.  You have persistent nausea, vomiting, or diarrhea.  You have a bad smelling vaginal discharge.  You have pain with urination. SEEK IMMEDIATE MEDICAL CARE IF:   You have a fever.  You are leaking fluid from your vagina.  You have spotting or bleeding from your vagina.  You have severe abdominal cramping or pain.  You have rapid weight loss or gain.  You have shortness of breath with chest pain.  You notice sudden or extreme swelling of your face, hands, ankles, feet, or legs.  You have not felt your baby move in over an hour.  You have severe headaches that do not go away with medicine.  You have vision changes. Document Released: 01/21/2001 Document Revised: 02/01/2013 Document Reviewed: 03/30/2012 [facility] Patient Information 2015 [place], [place]. This information is not intended to replace advice  given to you by your health care provider. Make sure you discuss any questions you have with your health care provider.  PROTECT YOURSELF & YOUR BABY FROM THE FLU! Because you are pregnant, we at [facility], along with the Centers for Disease Control (CDC), recommend that you receive the flu vaccine to protect yourself and your baby from the flu. The flu is more likely to cause severe illness in pregnant women than in women of reproductive age who are not pregnant. Changes in the immune system, heart, and lungs during pregnancy make pregnant women (and women up to two weeks postpartum) more prone to severe illness from flu, including illness resulting in hospitalization. Flu also may be harmful for a pregnant woman's developing baby. A common flu symptom is fever, which may be associated with neural tube defects and other adverse outcomes for a developing baby. Getting vaccinated can also help protect a baby after birth from flu. (Mom passes antibodies onto the developing baby during her pregnancy.)  A Flu Vaccine is the Best Protection Against Flu Getting a flu vaccine is the first and most important step in protecting against flu. Pregnant women should get a flu shot and not the live attenuated influenza vaccine (LAIV), also known as nasal spray flu vaccine. Flu vaccines given during pregnancy help protect both the mother and her baby from flu. Vaccination has been shown to reduce the risk of flu-associated acute respiratory infection in pregnant women by up to one-half. A 2018 study showed that getting a flu shot reduced a pregnant woman's risk of being hospitalized with flu by an average of 40 percent. Pregnant women who get a flu vaccine are also helping to protect their babies from flu illness for the first several months after their birth, when they are too  young to get vaccinated.   A Long Record of Safety for Flu Shots in Pregnant Women Flu shots have been given to millions of pregnant women over  many years with a good safety record. There is a lot of evidence that flu vaccines can be given safely during pregnancy; though these data are limited for the first trimester. The CDC recommends that pregnant women get vaccinated during any trimester of their pregnancy. It is very important for pregnant women to get the flu shot.   Other Preventive Actions In addition to getting a flu shot, pregnant women should take the same everyday preventive actions the CDC recommends of everyone, including covering coughs, washing hands often, and avoiding people who are sick.  Symptoms and Treatment If you get sick with flu symptoms call your doctor right away. There are antiviral drugs that can treat flu illness and prevent serious flu complications. The CDC recommends prompt treatment for people who have influenza infection or suspected influenza infection and who are at high risk of serious flu complications, such as people with asthma, diabetes (including gestational diabetes), or heart disease. Early treatment of influenza in hospitalized pregnant women has been shown to reduce the length of the hospital stay.  Symptoms Flu symptoms include fever, cough, sore throat, runny or stuffy nose, body aches, headache, chills and fatigue. Some people may also have vomiting and diarrhea. People may be infected with the flu and have respiratory symptoms without a fever.  Early Treatment is Important for Pregnant Women Treatment should begin as soon as possible because antiviral drugs work best when started early (within 48 hours after symptoms start). Antiviral drugs can make your flu illness milder and make you feel better faster. They may also prevent serious health problems that can result from flu illness. Oral oseltamivir (Tamiflu) is the preferred treatment for pregnant women because it has the most studies available to suggest that it is safe and beneficial. Antiviral drugs require a prescription from your  provider. Having a fever caused by flu infection or other infections early in pregnancy may be linked to birth defects in a baby. In addition to taking antiviral drugs, pregnant women who get a fever should treat their fever with Tylenol (acetaminophen) and contact their provider immediately.  When to [place] If you are pregnant and have any of these signs, seek care immediately:  Difficulty breathing or shortness of breath  Pain or pressure in the chest or abdomen  Sudden dizziness  Confusion  Severe or persistent vomiting  High fever that is not responding to Tylenol (or store brand equivalent)  Decreased or no movement of your baby  [email or web address]

## 2018-12-29 NOTE — Progress Notes (Signed)
   LOW-RISK PREGNANCY VISIT Patient name: Mary [name] MRN [id number]  Date of birth: Jul 20, 1985 Chief Complaint:   Routine Prenatal Visit  History of Present Illness:   Mary [name] is a 33 y.o. G72P1021 female at [redacted]w[redacted]d with an Estimated Date of Delivery: 01/05/19 being seen today for ongoing management of a low-risk pregnancy.  Today she reports had MAU eval for ROM on 11/15- negative. Cx was 4/50. Now with intermittent mucousy vag d/c.Marland Kitchen Contractions: Irregular. Vag. Bleeding: None.  Movement: Present. denies leaking of fluid. Wants to start maternity leave today. Denies s/s HSV. Review of Systems:   Pertinent items are noted in HPI Denies abnormal vaginal discharge w/ itching/odor/irritation, headaches, visual changes, shortness of breath, chest pain, abdominal pain, severe nausea/vomiting, or problems with urination or bowel movements unless otherwise stated above. Pertinent History Reviewed:  Reviewed past medical,surgical, social, obstetrical and family history.  Reviewed problem list, medications and allergies. Physical Assessment:   Vitals:   12/29/18 0856  BP: 118/83  Pulse: 73  Weight: 219 lb (99.3 kg)  Body mass index is 37.59 kg/m.        Physical Examination:   General appearance: Well appearing, and in no distress  Mental status: Alert, oriented to person, place, and time  Skin: Warm & dry  Cardiovascular: Normal heart rate noted  Respiratory: Normal respiratory effort, no distress  Abdomen: Soft, gravid, nontender  Pelvic: Cervical exam performed  Dilation: 3 Effacement (%): 50 Station: -2  Extremities:    Fetal Status: Fetal Heart Rate (bpm): 145 Fundal Height: 39 cm Movement: Present Presentation: Vertex  Results for orders placed or performed in visit on 12/29/18 (from the past 24 hour(s))  POC Urinalysis Dipstick OB   Collection Time: 12/29/18  8:57 AM  Result Value Ref Range   Color, UA     Clarity, UA     Glucose, UA Negative Negative   Bilirubin,  UA     Ketones, UA n    Spec Grav, UA     Blood, UA n    pH, UA     POC,PROTEIN,UA Negative Negative, Trace, Small (1+), Moderate (2+), Large (3+), 4+   Urobilinogen, UA     Nitrite, UA n    Leukocytes, UA Negative Negative   Appearance     Odor      Assessment & Plan:  1) Low-risk pregnancy G4P1021 at [redacted]w[redacted]d with an Estimated Date of Delivery: 01/05/19   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  Labs/procedures today: none  Plan:  Continue routine obstetrical care with NST next week if still pregnant, and will schedule IOL for 41wks  Reviewed: Term labor symptoms and general obstetric precautions including but not limited to vaginal bleeding, contractions, leaking of fluid and fetal movement were reviewed in detail with the patient.  All questions were answered. Has home bp cuff.  Check bp weekly, let us know if >140/90.   Follow-up: Return in about 1 week (around 01/05/2019) for NST.  Orders Placed This Encounter  Procedures  . POC Urinalysis Dipstick OB   Myrtis Ser [facility] 12/29/2018 9:29 AM

## 2019-01-01 ENCOUNTER — Inpatient Hospital Stay (HOSPITAL_COMMUNITY)
Admission: AD | Admit: 2019-01-01 | Discharge: 2019-01-03 | DRG: 806 | Disposition: A | Discharge status: Home / Self Care | Payer: 59 | Attending: Family Medicine | Admitting: Family Medicine

## 2019-01-01 ENCOUNTER — Encounter (HOSPITAL_COMMUNITY): Payer: Self-pay

## 2019-01-01 DIAGNOSIS — Z3A39 39 weeks gestation of pregnancy: Secondary | ICD-10-CM

## 2019-01-01 DIAGNOSIS — O99824 Streptococcus B carrier state complicating childbirth: Secondary | ICD-10-CM | POA: Diagnosis not present

## 2019-01-01 DIAGNOSIS — A6000 Herpesviral infection of urogenital system, unspecified: Secondary | ICD-10-CM | POA: Diagnosis present

## 2019-01-01 DIAGNOSIS — O09899 Supervision of other high risk pregnancies, unspecified trimester: Secondary | ICD-10-CM

## 2019-01-01 DIAGNOSIS — Z283 Underimmunization status: Secondary | ICD-10-CM

## 2019-01-01 DIAGNOSIS — Z20828 Contact with and (suspected) exposure to other viral communicable diseases: Secondary | ICD-10-CM | POA: Diagnosis present

## 2019-01-01 DIAGNOSIS — O9832 Other infections with a predominantly sexual mode of transmission complicating childbirth: Secondary | ICD-10-CM | POA: Diagnosis not present

## 2019-01-01 DIAGNOSIS — B951 Streptococcus, group B, as the cause of diseases classified elsewhere: Secondary | ICD-10-CM

## 2019-01-01 DIAGNOSIS — R768 Other specified abnormal immunological findings in serum: Secondary | ICD-10-CM | POA: Diagnosis present

## 2019-01-01 LAB — POCT FERN TEST: POCT Fern Test: POSITIVE

## 2019-01-01 LAB — CBC
HCT: 32.1 % — ABNORMAL LOW (ref 36.0–46.0)
Hemoglobin: 10.6 g/dL — ABNORMAL LOW (ref 12.0–15.0)
MCH: 28.8 pg (ref 26.0–34.0)
MCHC: 33 g/dL (ref 30.0–36.0)
MCV: 87.2 fL (ref 80.0–100.0)
Platelets: 251 10*3/uL (ref 150–400)
RBC: 3.68 MIL/uL — ABNORMAL LOW (ref 3.87–5.11)
RDW: 14.1 % (ref 11.5–15.5)
WBC: 10.7 10*3/uL — ABNORMAL HIGH (ref 4.0–10.5)
nRBC: 0 % (ref 0.0–0.2)

## 2019-01-01 MED ORDER — LACTATED RINGERS IV SOLN
INTRAVENOUS | Status: DC
  Administered 2019-01-01 – 2019-01-02 (×3): via INTRAVENOUS

## 2019-01-01 NOTE — MAU Note (Signed)
Patient presents to MAU c/o ctx that started around 2000. Patient reports "clear liquid gush around 1945". Patient reports fetal movement, "just not as active as usual." Denies vaginal bleeding

## 2019-01-01 NOTE — H&P (Addendum)
LABOR AND DELIVERY ADMISSION HISTORY AND PHYSICAL NOTE  Mary [name] is a 33 y.o. female [phone] with IUP at [redacted]w[redacted]d by LMP c/w 8wk [place] presenting for SROM.  She reports positive fetal movement. She denies vaginal bleeding. Positive loss of fluids around 1945 and is fern positive. Having contractions every 1-2 minutes. Would like epidural.  [facility] History/Com[facility] Pregnancy complications:  - h/o HSV, no current symptoms and taking valtrex - GBS pos - rubella non immune - EFW 95%  Past Medical History: Past Medical History:  Diagnosis Date  . UTI (urinary tract infection) in pregnancy in first trimester 05/14/2012  . Wisdom teeth extracted 2007    Past Surgical History: Past Surgical History:  Procedure Laterality Date  . NO PAST SURGERIES    . WISDOM TOOTH EXTRACTION      Obstetrical History: OB History    Gravida  4   Para  1   Term  1   Preterm      AB  2   Living  1     SAB  2   TAB      Ectopic      Multiple      Live Births  1           Social History: Social History   Socioeconomic History  . Marital status: Married    Spouse name: Not on file  . Number of children: 1  . Years of education: Not on file  . Highest education level: Not on file  Occupational History    Comment: [employer]   Social Needs  . Financial resource strain: Not on file  . Food insecurity    Worry: Not on file    Inability: Not on file  . Transportation needs    Medical: Not on file    Non-medical: Not on file  Tobacco Use  . Smoking status: Never Smoker  . Smokeless tobacco: Never Used  Substance and Sexual Activity  . Alcohol use: No  . Drug use: No  . Sexual activity: Yes    Birth control/protection: None  Lifestyle  . Physical activity    Days per week: Not on file    Minutes per session: Not on file  . Stress: Not on file  Relationships  . Social Herbalist on phone: Not on file    Gets together: Not  on file    Attends religious service: Not on file    Active member of club or organization: Not on file    Attends meetings of clubs or organizations: Not on file    Relationship status: Not on file  Other Topics Concern  . Not on file  Social History Narrative  . Not on file    Family History: Family History  Problem Relation Age of Onset  . Diabetes Mother   . Hypertension Mother   . Asthma Father   . COPD Father   . Stroke Maternal Grandfather   . Cancer Maternal Grandfather     Allergies: No Known Allergies  Medications Prior to Admission  Medication Sig Dispense Refill Last Dose  . Prenatal MV & Min w/FA-DHA (PRENATAL ADULT GUMMY/DHA/FA PO) Take by mouth. Takes 2 daily     . silver sulfADIAZINE (SILVADENE) 1 % cream Use to area 3-4 times daily 50 g 11   . valACYclovir (VALTREX) 500 MG tablet Take 1 tablet (500 mg total) by mouth 2 (two) times daily. 60 tablet  6      Review of Systems  All systems reviewed and negative except as stated in HPI  Physical Exam Blood pressure 129/88, pulse (!) 101, temperature 98.4 F (36.9 C), temperature source Oral, resp. rate 20, last menstrual period 03/31/2018. General appearance: alert, oriented, NAD Lungs: normal respiratory effort Heart: regular rate Abdomen: soft, non-tender; gravid, FH appropriate for GA Extremities: No calf swelling or tenderness Presentation: cephali Fetal monitoring: cat I Uterine activity: moderate    Prenatal labs: ABO, Rh: AB/Positive/-- (05/19 1338) Antibody: Negative (08/26 0849) Rubella: <0.90 (05/19 1338) RPR: Non Reactive (08/26 0849)  HBsAg: Negative (05/19 1338)  HIV: Non Reactive (08/26 0849)  GC/Chlamydia: neg/neg GBS: --/Positive (11/04 1300)  2-hr GTT: neg Genetic screening:  NT/IT nef, AFP materni T21 Anatomy US: normal  Prenatal Transfer Tool  Maternal Diabetes: No Genetic Screening: Normal Maternal Ultrasounds/Referrals: Normal Fetal Ultrasounds or other Referrals:   None Maternal Substance Abuse:  No Significant Maternal Medications:  None Significant Maternal Lab Results: Group B Strep positive and Other: HSV positive, rubella non-immune  Results for orders placed or performed during the hospital encounter of 01/01/19 (from the past 24 hour(s))  Fern Test   Collection Time: 01/01/19 11:30 PM  Result Value Ref Range   POCT Fern Test Positive = ruptured amniotic membanes     Patient Active Problem List   Diagnosis Date Noted  . Positive GBS test 12/29/2018  . Request for sterilization 11/03/2018  . UTI (urinary tract infection) during pregnancy, second trimester 08/03/2018  . Rubella non-immune status, antepartum 07/05/2018  . Supervision of normal pregnancy 07/05/2018  . Pap smear of cervix shows high risk HPV present 07/04/2016  . Adjustment disorder with anxious mood 10/13/2013  . 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    Assessment: Mary [name] is a 33 y.o. G4P1021 at [redacted]w[redacted]d here for SROM. Cervical check at clinic visit 11/18 showed 3/50/-2 vertex. Clear membrane rupture around 1945 today. Contractions starting aroung 2000.  #Labor: expectant management #Pain: Epidural planning #FWB: Cat I #ID:  GBS positive- PCN ordered #MOF: both #MOC:COCs and BTL at 6 months #Circ:  yes  Mary Rushing, DO PGY-2 FM 01/01/2019, 11:31 PM     OB FELLOW ATTESTATION  I have seen and examined this patient and agree with above documentation in the resident's note except as noted below.  33 y/o G4P1021 at [redacted]w[redacted]d here for SROM for clear. GBS positive, plan for 4hr expectant management until GBS adequately treated, after that will check cervix and augment PRN. Confirmed she desires interval BTL, concerned NT:ZGYF or other event post partum which is why she desires long interval period. Rubella non-immune. Hx of HSV but denies ever having any outbreaks, no lesions seen on external exam, she is compliant with valtrex.   Zack Seal, MD/[facility] OB Fellow   01/02/2019, 12:43 AM

## 2019-01-02 ENCOUNTER — Inpatient Hospital Stay (HOSPITAL_COMMUNITY): Payer: 59 | Admitting: Anesthesiology

## 2019-01-02 ENCOUNTER — Other Ambulatory Visit: Payer: Self-pay

## 2019-01-02 ENCOUNTER — Encounter (HOSPITAL_COMMUNITY): Payer: Self-pay

## 2019-01-02 DIAGNOSIS — Z20828 Contact with and (suspected) exposure to other viral communicable diseases: Secondary | ICD-10-CM | POA: Diagnosis present

## 2019-01-02 DIAGNOSIS — O9852 Other viral diseases complicating childbirth: Secondary | ICD-10-CM | POA: Diagnosis not present

## 2019-01-02 DIAGNOSIS — O26893 Other specified pregnancy related conditions, third trimester: Secondary | ICD-10-CM | POA: Diagnosis present

## 2019-01-02 DIAGNOSIS — A6000 Herpesviral infection of urogenital system, unspecified: Secondary | ICD-10-CM | POA: Diagnosis present

## 2019-01-02 DIAGNOSIS — Z3A39 39 weeks gestation of pregnancy: Secondary | ICD-10-CM | POA: Diagnosis not present

## 2019-01-02 DIAGNOSIS — O99824 Streptococcus B carrier state complicating childbirth: Secondary | ICD-10-CM | POA: Diagnosis not present

## 2019-01-02 DIAGNOSIS — B009 Herpesviral infection, unspecified: Secondary | ICD-10-CM | POA: Diagnosis not present

## 2019-01-02 DIAGNOSIS — O9832 Other infections with a predominantly sexual mode of transmission complicating childbirth: Secondary | ICD-10-CM | POA: Diagnosis present

## 2019-01-02 LAB — RPR: RPR Ser Ql: NONREACTIVE

## 2019-01-02 LAB — SARS CORONAVIRUS 2 BY RT PCR (HOSPITAL ORDER, PERFORMED IN ~~LOC~~ HOSPITAL LAB): SARS Coronavirus 2: NEGATIVE

## 2019-01-02 MED ORDER — MEASLES, MUMPS & RUBELLA VAC IJ SOLR
0.5000 mL | Freq: Once | INTRAMUSCULAR | Status: AC
  Administered 2019-01-03: 0.5 mL via SUBCUTANEOUS
  Filled 2019-01-02: qty 0.5

## 2019-01-02 MED ORDER — WITCH HAZEL-GLYCERIN EX PADS: 1.0000 "application " | MEDICATED_PAD | CUTANEOUS | Status: DC | PRN

## 2019-01-02 MED ORDER — LACTATED RINGERS IV SOLN: 500.0000 mL | INTRAVENOUS | Status: DC | PRN

## 2019-01-02 MED ORDER — LACTATED RINGERS IV SOLN
500.0000 mL | Freq: Once | INTRAVENOUS | Status: AC
  Administered 2019-01-02: 01:00:00 500 mL via INTRAVENOUS

## 2019-01-02 MED ORDER — PHENYLEPHRINE 40 MCG/ML (10ML) SYRINGE FOR IV PUSH (FOR BLOOD PRESSURE SUPPORT): 80.0000 ug | PREFILLED_SYRINGE | INTRAVENOUS | Status: DC | PRN

## 2019-01-02 MED ORDER — OXYCODONE-ACETAMINOPHEN 5-325 MG PO TABS: 1.0000 | ORAL_TABLET | ORAL | Status: DC | PRN

## 2019-01-02 MED ORDER — FERROUS SULFATE 325 (65 FE) MG PO TABS: 325.0000 mg | ORAL_TABLET | Freq: Two times a day (BID) | ORAL | Status: DC

## 2019-01-02 MED ORDER — OXYTOCIN 40 UNITS IN NORMAL SALINE INFUSION - SIMPLE MED
2.5000 [IU]/h | INTRAVENOUS | Status: DC
  Filled 2019-01-02: qty 1000

## 2019-01-02 MED ORDER — EPHEDRINE 5 MG/ML INJ: 10.0000 mg | INTRAVENOUS | Status: DC | PRN

## 2019-01-02 MED ORDER — BENZOCAINE-MENTHOL 20-0.5 % EX AERO
1.0000 "application " | INHALATION_SPRAY | CUTANEOUS | Status: DC | PRN
  Administered 2019-01-02: 1 via TOPICAL
  Filled 2019-01-02: qty 56

## 2019-01-02 MED ORDER — OXYTOCIN BOLUS FROM INFUSION
500.0000 mL | Freq: Once | INTRAVENOUS | Status: AC
  Administered 2019-01-02: 16:00:00 500 mL via INTRAVENOUS

## 2019-01-02 MED ORDER — SODIUM CHLORIDE 0.9 % IV SOLN
5.0000 10*6.[IU] | Freq: Once | INTRAVENOUS | Status: AC
  Administered 2019-01-02: 5 10*6.[IU] via INTRAVENOUS
  Filled 2019-01-02: qty 5

## 2019-01-02 MED ORDER — OXYCODONE-ACETAMINOPHEN 5-325 MG PO TABS: 2.0000 | ORAL_TABLET | ORAL | Status: DC | PRN

## 2019-01-02 MED ORDER — DIPHENHYDRAMINE HCL 25 MG PO CAPS: 25.0000 mg | ORAL_CAPSULE | Freq: Four times a day (QID) | ORAL | Status: DC | PRN

## 2019-01-02 MED ORDER — ONDANSETRON HCL 4 MG/2ML IJ SOLN
4.0000 mg | Freq: Four times a day (QID) | INTRAMUSCULAR | Status: DC | PRN
  Administered 2019-01-02: 10:00:00 4 mg via INTRAVENOUS
  Filled 2019-01-02: qty 2

## 2019-01-02 MED ORDER — DIBUCAINE (PERIANAL) 1 % EX OINT: 1.0000 "application " | TOPICAL_OINTMENT | CUTANEOUS | Status: DC | PRN

## 2019-01-02 MED ORDER — IBUPROFEN 600 MG PO TABS
600.0000 mg | ORAL_TABLET | Freq: Four times a day (QID) | ORAL | Status: DC
  Administered 2019-01-02 – 2019-01-03 (×4): 600 mg via ORAL
  Filled 2019-01-02 (×4): qty 1

## 2019-01-02 MED ORDER — DIPHENHYDRAMINE HCL 50 MG/ML IJ SOLN: 12.5000 mg | INTRAMUSCULAR | Status: DC | PRN

## 2019-01-02 MED ORDER — LIDOCAINE HCL (PF) 1 % IJ SOLN: 30.0000 mL | INTRAMUSCULAR | Status: DC | PRN

## 2019-01-02 MED ORDER — ZOLPIDEM TARTRATE 5 MG PO TABS: 5.0000 mg | ORAL_TABLET | Freq: Every evening | ORAL | Status: DC | PRN

## 2019-01-02 MED ORDER — FENTANYL CITRATE (PF) 100 MCG/2ML IJ SOLN: 50.0000 ug | INTRAMUSCULAR | Status: DC | PRN

## 2019-01-02 MED ORDER — PRENATAL MULTIVITAMIN CH
1.0000 | ORAL_TABLET | Freq: Every day | ORAL | Status: DC
  Administered 2019-01-03: 11:00:00 1 via ORAL
  Filled 2019-01-02: qty 1

## 2019-01-02 MED ORDER — FLEET ENEMA 7-19 GM/118ML RE ENEM: 1.0000 | ENEMA | RECTAL | Status: DC | PRN

## 2019-01-02 MED ORDER — TETANUS-DIPHTH-ACELL PERTUSSIS 5-2.5-18.5 LF-MCG/0.5 IM SUSP: 0.5000 mL | Freq: Once | INTRAMUSCULAR | Status: DC

## 2019-01-02 MED ORDER — LIDOCAINE HCL (PF) 1 % IJ SOLN
INTRAMUSCULAR | Status: DC | PRN
  Administered 2019-01-02 (×2): 4 mL via EPIDURAL

## 2019-01-02 MED ORDER — LACTATED RINGERS IV SOLN: 500.0000 mL | Freq: Once | INTRAVENOUS | Status: DC

## 2019-01-02 MED ORDER — ONDANSETRON HCL 4 MG/2ML IJ SOLN: 4.0000 mg | INTRAMUSCULAR | Status: DC | PRN

## 2019-01-02 MED ORDER — PENICILLIN G POT IN DEXTROSE 60000 UNIT/ML IV SOLN
3.0000 10*6.[IU] | INTRAVENOUS | Status: DC
  Administered 2019-01-02 (×3): 3 10*6.[IU] via INTRAVENOUS
  Filled 2019-01-02 (×3): qty 50

## 2019-01-02 MED ORDER — MAGNESIUM HYDROXIDE 400 MG/5ML PO SUSP: 30.0000 mL | ORAL | Status: DC | PRN

## 2019-01-02 MED ORDER — COCONUT OIL OIL: 1.0000 "application " | TOPICAL_OIL | Status: DC | PRN

## 2019-01-02 MED ORDER — ACETAMINOPHEN 325 MG PO TABS: 650.0000 mg | ORAL_TABLET | ORAL | Status: DC | PRN

## 2019-01-02 MED ORDER — TERBUTALINE SULFATE 1 MG/ML IJ SOLN: 0.2500 mg | Freq: Once | INTRAMUSCULAR | Status: DC | PRN

## 2019-01-02 MED ORDER — SODIUM CHLORIDE (PF) 0.9 % IJ SOLN
INTRAMUSCULAR | Status: DC | PRN
  Administered 2019-01-02: 12 mL/h via EPIDURAL

## 2019-01-02 MED ORDER — ONDANSETRON HCL 4 MG PO TABS: 4.0000 mg | ORAL_TABLET | ORAL | Status: DC | PRN

## 2019-01-02 MED ORDER — SOD CITRATE-CITRIC ACID 500-334 MG/5ML PO SOLN
30.0000 mL | ORAL | Status: DC | PRN
  Administered 2019-01-02: 14:00:00 30 mL via ORAL
  Filled 2019-01-02: qty 30

## 2019-01-02 MED ORDER — SIMETHICONE 80 MG PO CHEW: 80.0000 mg | CHEWABLE_TABLET | ORAL | Status: DC | PRN

## 2019-01-02 MED ORDER — FENTANYL-BUPIVACAINE-NACL 0.5-0.125-0.9 MG/250ML-% EP SOLN
12.0000 mL/h | EPIDURAL | Status: DC | PRN
  Filled 2019-01-02: qty 250

## 2019-01-02 MED ORDER — OXYTOCIN 40 UNITS IN NORMAL SALINE INFUSION - SIMPLE MED
1.0000 m[IU]/min | INTRAVENOUS | Status: DC
  Administered 2019-01-02: 2 m[IU]/min via INTRAVENOUS

## 2019-01-02 NOTE — Anesthesia Preprocedure Evaluation (Signed)
Anesthesia Evaluation  Patient identified by MRN, date of birth, ID band Patient awake    Reviewed: Allergy & Precautions, H&P , Patient's Chart, lab work & pertinent test results  Airway Mallampati: III  TM Distance: >3 FB Neck ROM: Full    Dental no notable dental hx. (+) Teeth Intact   Pulmonary neg pulmonary ROS,    Pulmonary exam normal breath sounds clear to auscultation       Cardiovascular negative cardio ROS Normal cardiovascular exam Rhythm:Regular Rate:Normal     Neuro/Psych negative neurological ROS  negative psych ROS   GI/Hepatic Neg liver ROS, GERD  Medicated and Controlled,  Endo/Other  negative endocrine ROS  Renal/GU negative Renal ROS  negative genitourinary   Musculoskeletal negative musculoskeletal ROS (+)   Abdominal (+) + obese,   Peds  Hematology  (+) Blood dyscrasia, anemia ,   Anesthesia Other Findings   Reproductive/Obstetrics (+) Pregnancy HSV                             Anesthesia Physical  Anesthesia Plan  ASA: II  Anesthesia Plan: Epidural   Post-op Pain Management:    Induction:   PONV Risk Score and Plan:   Airway Management Planned: Natural Airway  Additional Equipment:   Intra-op Plan:   Post-operative Plan:   Informed Consent: I have reviewed the patients History and Physical, chart, labs and discussed the procedure including the risks, benefits and alternatives for the proposed anesthesia with the patient or authorized representative who has indicated his/her understanding and acceptance.       Plan Discussed with: Anesthesiologist  Anesthesia Plan Comments:         Anesthesia Quick Evaluation

## 2019-01-02 NOTE — Progress Notes (Signed)
Labor Progress Note Mary [name] is a 33 y.o. [phone] at [redacted]w[redacted]d presented for SROM S: Feeling more pressure, uncomfortable, wanted to do a trial push but not feeling strong urge to push  O:  BP 139/74   Pulse 92   Temp 99 F (37.2 C) (Oral)   Resp 18   Ht 5\' 4"  (1.626 m)   Wt 99.3 kg   LMP 03/31/2018   SpO2 98%   BMI 37.59 kg/m  EFM: 145 / moderate variability / 15 x 15 accels present, variable decels present  CVE: Dilation: 10 Dilation Complete Date: 01/02/19 Dilation Complete Time: 1333 Effacement (%): 90 Cervical Position: Middle Station: Plus 1 Presentation: Vertex Exam by:: Tamala Julian, CNM   A&P: 32 y.o. [id number] [redacted]w[redacted]d here for SROM #Labor: Progressing well. Did a few practice pushes, moved baby down somewhat. Not having strong urge to push. Felt to have adequate pelvis. Will labor down ~1 hr #Pain: epidural  #FWB: Cat 1 #GBS positive s/p pcn  Aron Baba, Medical Student 2:01 PM

## 2019-01-02 NOTE — Progress Notes (Signed)
Mary [name] is a 33 y.o. [id number] at [redacted]w[redacted]d.  Subjective: Comfortable w/ epidural.    Objective: BP 126/84   Pulse 87   Temp 98.1 F (36.7 C) (Oral)   Resp 18   Ht 5\' 4"  (1.626 m)   Wt 99.3 kg   LMP 03/31/2018   SpO2 98%   BMI 37.59 kg/m    FHT:  FHR: 145 bpm, variability: mod,  accelerations:  15x15,  decelerations:  none UC:   Q 2-4 minutes, mod-strong Dilation: 6 Effacement (%): 80 Cervical Position: Middle Station: -1 Presentation: Vertex Exam by:: J.Cox, RN  Labs: Results for orders placed or performed during the hospital encounter of 01/01/19 (from the past 24 hour(s))  Fern Test     Status: Abnormal   Collection Time: 01/01/19 11:30 PM  Result Value Ref Range   POCT Fern Test Positive = ruptured amniotic membanes   Type and screen [place]     Status: None   Collection Time: 01/01/19 11:39 PM  Result Value Ref Range   ABO/RH(D) AB POS    Antibody Screen NEG    Sample Expiration      01/04/2019,2359 Performed at [facility], [place] [address] [place], [place] [address]   CBC     Status: Abnormal   Collection Time: 01/01/19 11:39 PM  Result Value Ref Range   WBC 10.7 (H) 4.0 - 10.5 K/uL   RBC 3.68 (L) 3.87 - 5.11 MIL/uL   Hemoglobin 10.6 (L) 12.0 - 15.0 g/dL   HCT 32.1 (L) 36.0 - 46.0 %   MCV 87.2 80.0 - 100.0 fL   MCH 28.8 26.0 - 34.0 pg   MCHC 33.0 30.0 - 36.0 g/dL   RDW 14.1 11.5 - 15.5 %   Platelets 251 150 - 400 K/uL   nRBC 0.0 0.0 - 0.2 %  SARS Coronavirus 2 by RT PCR (hospital order, performed in [place] hospital lab) Nasopharyngeal Nasopharyngeal Swab     Status: None   Collection Time: 01/01/19 11:50 PM   Specimen: Nasopharyngeal Swab  Result Value Ref Range   SARS Coronavirus 2 NEGATIVE NEGATIVE    Assessment / Plan: [redacted]w[redacted]d week IUP Labor: Active Fetal Wellbeing:  Category I Pain Control:  Epidural Anticipated MOD:  SVD  Tamala Julian [place], [place] 01/02/2019 10:10 AM

## 2019-01-02 NOTE — Discharge Summary (Signed)
Postpartum Discharge Summary     Patient Name: Mary [name] DOB: [birth year] MRN: [id number]  Date of admission: 01/01/2019 Delivering Provider: Manya Silvas   Date of discharge: 01/03/2019  Admitting diagnosis: 85 wks ctx Intrauterine pregnancy: [redacted]w[redacted]d    Secondary diagnosis:  Active Problems:   HSV-2 seropositive   Rubella non-immune status, antepartum   Positive GBS test   Vaginal delivery   Shoulder dystocia, delivered, current hospitalization   [redacted] weeks gestation of pregnancy  Additional problems: None     Discharge diagnosis: Term Pregnancy Delivered                                                                                                Post partum procedures:None  Augmentation: Pitocin  Complications: 90 second Shoulder Dystocia  Hospital course:  Onset of Labor With Vaginal Delivery     33y.o. yo [id number] 31w4das [place] in Latent Labor[place]. Patient had an uncomplicated labor course as follows:  Membrane Rupture Time/Date: 8:00 PM ,01/01/2019   Intrapartum Procedures: Episiotomy: Median [2]                                         Lacerations:  2nd degree [3];Perineal [11]  Patient had a delivery of a Viable infant. 90 second shoulder dystocia.  01/02/2019  Information for the patient's newborn:  HuZully, Frane0[[id number]]Delivery Method: Vaginal, Spontaneous(Filed from Delivery Summary)  Wt 9-7.     Pateint had an uncomplicated postpartum course. Started on PO iron and prescribed on discharge. She would like an interval BTL. She is ambulating, tolerating a regular diet, passing flatus, and urinating well. Patient is discharged home in stable condition on 01/03/19.  Delivery time: 3:30 PM    Magnesium Sulfate received: No BMZ received: No Rhophylac:N/A  MMR: ordered Transfusion:No  Physical exam  Vitals:   01/02/19 2331 01/03/19 0330 01/03/19 0740 01/03/19 1352  BP: 119/73 125/81 121/79 109/65  Pulse: 83  88 82 85  Resp: '20 18 16 18  ' Temp: 98 F (36.7 C) 97.8 F (36.6 C) 97.8 F (36.6 C) 98.3 F (36.8 C)  TempSrc: Oral Oral Oral Oral  SpO2: 100% 100% 100% 100%  Weight:      Height:       General: alert, cooperative and no distress Lochia: appropriate Uterine Fundus: firm DVT Evaluation: No evidence of DVT seen on physical exam. Labs: Lab Results  Component Value Date   WBC 14.8 (H) 01/03/2019   HGB 8.3 (L) 01/03/2019   HCT 24.7 (L) 01/03/2019   MCV 86.7 01/03/2019   PLT 194 01/03/2019   CMP Latest Ref Rng & Units 07/20/2017  Glucose 65 - 99 mg/dL 93  BUN 6 - 20 mg/dL 12  Creatinine 0.57 - 1.00 mg/dL 0.90  Sodium 134 - 144 mmol/L 140  Potassium 3.5 - 5.2 mmol/L 4.6  Chloride 96 - 106 mmol/L 104  CO2 20 - 29 mmol/L 22  Calcium 8.7 -  10.2 mg/dL 9.4  Total Protein 6.0 - 8.5 g/dL 6.9  Total Bilirubin 0.0 - 1.2 mg/dL 0.4  Alkaline Phos 39 - 117 IU/L 66  AST 0 - 40 IU/L 16  ALT 0 - 32 IU/L 18    Discharge instruction: per After Visit Summary and "Baby and Me Booklet".  After visit meds:  Allergies as of 01/03/2019   No Known Allergies     Medication List    STOP taking these medications   silver sulfADIAZINE 1 % cream Commonly known as: Silvadene   valACYclovir 500 MG tablet Commonly known as: VALTREX     TAKE these medications   acetaminophen 325 MG tablet Commonly known as: Tylenol Take 2 tablets (650 mg total) by mouth every 6 (six) hours as needed (for pain scale < 4).   calcium carbonate 500 MG chewable tablet Commonly known as: TUMS - dosed in mg elemental calcium Chew 1 tablet by mouth as needed for indigestion or heartburn.   ferrous sulfate 325 (65 FE) MG tablet Take 1 tablet (325 mg total) by mouth daily with breakfast. Start taking on: January 04, 2019   ibuprofen 600 MG tablet Commonly known as: ADVIL Take 1 tablet (600 mg total) by mouth every 6 (six) hours.   PRENATAL ADULT GUMMY/DHA/FA PO Take by mouth. Takes 2 daily       Diet:  routine diet  Activity: Advance as tolerated. Pelvic rest for 6 weeks.   Outpatient follow up:4 weeks Follow up Appt: Future Appointments  Date Time Provider [place]  02/07/2019  1:30 PM Cresenzo-Dishmon, Joaquim Lai, CNM [facility]   Follow up Visit:   Please schedule this patient for Postpartum visit in: 4 weeks with the following provider: Any provider For C/S patients schedule nurse incision check in weeks 2 weeks: no Low risk pregnancy complicated by: nothing Delivery mode:  SVD, Shoulder Dystocia Anticipated Birth Control:  Plans Interval BTL PP Procedures needed: None  Schedule Integrated BH visit: no    Newborn Data: Live born female  Birth Weight: 9 lb 7.5 oz (4295 g) APGAR: 3, 9  Newborn Delivery   Birth date/time: 01/02/2019 15:30:00 Delivery type: Vaginal, Spontaneous      Baby Feeding: Breast Disposition:home with mother   01/03/2019 Chauncey Mann, MD

## 2019-01-02 NOTE — Lactation Note (Signed)
This note was copied from a baby's chart. Lactation Consultation Note  Patient Name: Boy Britany Callicott HGDJM'E Date: 01/02/2019 Reason for consult: Initial assessment;Term P2, 7 hour female infant. Mom feeding choice at admission was breast and formula feeding. 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 Per mom, she doesn't want to latch infant to breast she plans to formula feed and pump only. Per mom, she plans to use DEBP when she gets home. [place] advised mom to pump in Hospital to help stimulate milk production and to pump both breast every 3 hours on initial setting. Per mom, she is tired and prefers to rest tonight.  LC discussed that if mom changes her mind and wants to latch infant at breast we are here to help support and assist her with breastfeeding.  Mom knows to feed infant according to cues and do as much STS as possible.  Maternal Data Formula Feeding for Exclusion: Yes Reason for exclusion: Mother's choice to formula and breast feed on admission Has patient been taught Hand Expression?: No Does the patient have breastfeeding experience prior to this delivery?: Yes  Feeding    LATCH Score                   Interventions Interventions: Skin to skin;DEBP  Lactation Tools Discussed/Used WIC Program: No   Consult Status Consult Status: Follow-up Date: 01/03/19 Follow-up type: In-patient    Vicente Serene 01/02/2019, 10:36 PM

## 2019-01-02 NOTE — Anesthesia Procedure Notes (Signed)
Epidural Patient location during procedure: OB Start time: 01/02/2019 1:03 AM End time: 01/02/2019 1:10 AM  Staffing Anesthesiologist: Josephine Igo, MD Performed: anesthesiologist   Preanesthetic Checklist Completed: patient identified, site marked, surgical consent, pre-op evaluation, timeout performed, IV checked, risks and benefits discussed and monitors and equipment checked  Epidural Patient position: sitting Prep: site prepped and draped and DuraPrep Patient monitoring: continuous pulse ox and blood pressure Approach: midline Location: L3-L4 Injection technique: LOR air  Needle:  Needle type: Tuohy  Needle gauge: 17 G Needle length: 9 cm and 9 Needle insertion depth: 5 cm cm Catheter type: closed end flexible Catheter size: 19 Gauge Catheter at skin depth: 10 cm Test dose: negative and Other  Assessment Events: blood not aspirated, injection not painful, no injection resistance, negative IV test and no paresthesia  Additional Notes Patient identified. Risks and benefits discussed including failed block, incomplete  Pain control, post dural puncture headache, nerve damage, paralysis, blood pressure Changes, nausea, vomiting, reactions to medications-both toxic and allergic and post Partum back pain. All questions were answered. Patient expressed understanding and wished to proceed. Sterile technique was used throughout procedure. Epidural site was Dressed with sterile barrier dressing. No paresthesias, signs of intravascular injection Or signs of intrathecal spread were encountered.  Patient was more comfortable after the epidural was dosed. Please see RN's note for documentation of vital signs and FHR which are stable. Reason for block:procedure for pain

## 2019-01-02 NOTE — Progress Notes (Signed)
LABOR PROGRESS NOTE  Mary [name] is a 33 y.o. [phone] at [redacted]w[redacted]d  admitted for SROM.  Subjective: Feeling more pressure. Doing well overall.  Objective: BP 122/90   Pulse 97   Temp 98.1 F (36.7 C) (Oral)   Resp 18   Ht 5\' 4"  (1.626 m)   Wt 99.3 kg   LMP 03/31/2018   SpO2 98%   BMI 37.59 kg/m  or  Vitals:   01/02/19 0430 01/02/19 0500 01/02/19 0530 01/02/19 0600  BP: (!) 98/55 114/70 115/69 122/90  Pulse: 73 78 82 97  Resp: 18   18  Temp:      TempSrc:      SpO2:      Weight:      Height:       0545 Dilation: 4 Effacement (%): 90 Cervical Position: Middle Station: -1 Presentation: Vertex Exam by:: Dr. Ouida Sills FHT: baseline rate 140, moderate varibility, 10x10 acel, decel Toco: moderate  Labs: Lab Results  Component Value Date   WBC 10.7 (H) 01/01/2019   HGB 10.6 (L) 01/01/2019   HCT 32.1 (L) 01/01/2019   MCV 87.2 01/01/2019   PLT 251 01/01/2019    Patient Active Problem List   Diagnosis Date Noted  . Vaginal delivery 01/02/2019  . Positive GBS test 12/29/2018  . Request for sterilization 11/03/2018  . UTI (urinary tract infection) during pregnancy, second trimester 08/03/2018  . Rubella non-immune status, antepartum 07/05/2018  . Supervision of normal pregnancy 07/05/2018  . Pap smear of cervix shows high risk HPV present 07/04/2016  . Adjustment disorder with anxious mood 10/13/2013  . 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 G4P1021 at [redacted]w[redacted]d here for SROM Some diastolic hypertension now resolved.  Labor: progressing gradually. Patient has had adequate GBS treatment at this point. Will begin pitocin ~0600.  Fetal Wellbeing:  Cat I Pain Control:  epidural Anticipated MOD:  vaginal  Doristine Mango, DO PGY-2 FM 01/02/2019, 6:03 AM

## 2019-01-03 DIAGNOSIS — Z3A39 39 weeks gestation of pregnancy: Secondary | ICD-10-CM

## 2019-01-03 LAB — CBC
HCT: 24.7 % — ABNORMAL LOW (ref 36.0–46.0)
Hemoglobin: 8.3 g/dL — ABNORMAL LOW (ref 12.0–15.0)
MCH: 29.1 pg (ref 26.0–34.0)
MCHC: 33.6 g/dL (ref 30.0–36.0)
MCV: 86.7 fL (ref 80.0–100.0)
Platelets: 194 10*3/uL (ref 150–400)
RBC: 2.85 MIL/uL — ABNORMAL LOW (ref 3.87–5.11)
RDW: 14.1 % (ref 11.5–15.5)
WBC: 14.8 10*3/uL — ABNORMAL HIGH (ref 4.0–10.5)
nRBC: 0 % (ref 0.0–0.2)

## 2019-01-03 LAB — TYPE AND SCREEN
ABO/RH(D): AB POS
Antibody Screen: NEGATIVE

## 2019-01-03 MED ORDER — FERROUS SULFATE 325 (65 FE) MG PO TABS
325.0000 mg | ORAL_TABLET | Freq: Every day | ORAL | Status: DC
  Administered 2019-01-03: 08:00:00 325 mg via ORAL
  Filled 2019-01-03: qty 1

## 2019-01-03 MED ORDER — IBUPROFEN 600 MG PO TABS: 600.0000 mg | ORAL_TABLET | Freq: Four times a day (QID) | ORAL | 0 refills | Status: DC

## 2019-01-03 MED ORDER — ACETAMINOPHEN 325 MG PO TABS: 650.0000 mg | ORAL_TABLET | Freq: Four times a day (QID) | ORAL | 0 refills | Status: DC | PRN

## 2019-01-03 MED ORDER — FERROUS SULFATE 325 (65 FE) MG PO TABS: 325.0000 mg | ORAL_TABLET | Freq: Every day | ORAL | 0 refills | Status: DC

## 2019-01-03 NOTE — Progress Notes (Addendum)
Post Partum Day 1 Subjective: Mary [name] is doing well this morning, enjoying time with her son. Comfortable w minimal pain. Eating and drinking, ambulating, voiding, passing flatus w/o difficulty. No HA, viz changes, sob, cp, abd p, n/v, leg pain.   Objective: Blood pressure 121/79, pulse 82, temperature 97.8 F (36.6 C), temperature source Oral, resp. rate 16, height 5\' 4"  (1.626 m), weight 99.3 kg, last menstrual period 03/31/2018, SpO2 100 %, unknown if currently breastfeeding.  Physical Exam:  General: ambulating comfortably in room, NAD CV: RRR, nl S1S2 Pulm: CTAB Abd: soft, no upper quadrant ttp Uterine Fundus: firm Lochia: appropriate DVT Evaluation: No evidence of DVT seen on physical exam. No cords or calf tenderness. Trace ankle and pretibial edema   Recent Labs    01/01/19 2339 01/03/19 0520  HGB 10.6* 8.3*  HCT 32.1* 24.7*    Assessment/Plan: Mionna Advincula is a 14I [id number] who is PPD#1 s/p SVD c/b ~1.5 min shoulder dystocia in stable condition this morning recovering well from delivery.   #Anemia - Delivery EBL 912 ml - Hgb 10.6 -> 8.3, Hct 32.1 -> 24.7, Plts 251 -> 194 this morning - starting ferrous sulfate 325 mg qam  #MOF: formula  #MOC: circ, inpt  #ID - GBS+ s/p tx w pcn - h/o HSV s/p ppx w valtrex, no s/sx peripartum  #Discharge: anticipate tomorrow   LOS: 1 day   Aron Baba 01/03/2019, 8:51 AM   GME ATTESTATION:  I saw and evaluated the patient. I agree with the findings and the plan of care as documented in the resident's note.  Merilyn Baba, DO OB Fellow [place] for [employer]

## 2019-01-03 NOTE — Anesthesia Postprocedure Evaluation (Signed)
Anesthesia Post Note  Patient: Mary [name]  Procedure(s) Performed: AN AD [place]     Patient location during evaluation: Mother Baby Anesthesia Type: Epidural Level of consciousness: awake, oriented and awake and alert Pain management: pain level controlled Vital Signs Assessment: post-procedure vital signs reviewed and stable Respiratory status: spontaneous breathing Cardiovascular status: blood pressure returned to baseline Postop Assessment: no apparent nausea or vomiting, able to ambulate, no headache and no backache Anesthetic complications: no    Last Vitals:  Vitals:   01/03/19 0330 01/03/19 0740  BP: 125/81 121/79  Pulse: 88 82  Resp: 18 16  Temp: 36.6 C 36.6 C  SpO2: 100% 100%    Last Pain:  Vitals:   01/03/19 0740  TempSrc: Oral  PainSc: 0-No pain   Pain Goal:                   [name]

## 2019-01-03 NOTE — Lactation Note (Signed)
This note was copied from a baby's chart. Lactation Consultation Note  Patient Name: Mary [name] QQPYP'P Date: 01/03/2019 Reason for consult: Follow-up assessment;Term  P2 mother whose infant is now 15 hours old.  Mother breast fed her first child (now 31 years old) for less than one week.  She prefers to pump and bottle feed this baby with formula supplementation.  She will not be latching baby to the breast.  Mother has not done any pumping with the DEBP since delivery.  Encouraged her to begin as soon as possible to help establish a good milk supply.  Discussed the importance of pumping consistency and performing hand expression before/after feedings to help with supply.  Mother verbalized understanding.    Baby had just returned from his circumcision and was sleeping.  Discussed post circumcision behavior with mother.  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    Engorgement prevention/treatment discussed.  Manual pump provided with instructions for use.  Mother has our OP phone number for questions after discharge.     Maternal Data Formula Feeding for Exclusion: Yes Reason for exclusion: Mother's choice to formula and breast feed on admission Has patient been taught Hand Expression?: Yes Does the patient have breastfeeding experience prior to this delivery?: Yes  Feeding    LATCH Score                   Interventions    Lactation Tools Discussed/Used WIC Program: No Pump Review: Setup, frequency, and cleaning(Manual pump reviewed) Initiated by:: Rashmi Tallent Date initiated:: 01/03/19   Consult Status Consult Status: Complete Date: 01/03/19 Follow-up type: Call as needed    Meesha Sek R Leanora Murin 01/03/2019, 11:39 AM

## 2019-01-05 ENCOUNTER — Other Ambulatory Visit: Payer: 59

## 2019-02-07 ENCOUNTER — Encounter: Payer: Self-pay | Admitting: Advanced Practice Midwife

## 2019-02-07 ENCOUNTER — Telehealth (INDEPENDENT_AMBULATORY_CARE_PROVIDER_SITE_OTHER): Payer: 59 | Admitting: Advanced Practice Midwife

## 2019-02-07 DIAGNOSIS — Z1389 Encounter for screening for other disorder: Secondary | ICD-10-CM | POA: Diagnosis not present

## 2019-02-07 MED ORDER — NORGESTIMATE-ETH ESTRADIOL 0.25-35 MG-MCG PO TABS: 1.0000 | ORAL_TABLET | Freq: Every day | ORAL | 11 refills | Status: DC

## 2019-02-07 NOTE — Progress Notes (Signed)
TELEHEALTH VIRTUAL POSTPARTUM VISIT ENCOUNTER NOTE  I connected with@ on 02/07/19 at  1:30 PM EST by telephone at home and verified that I am speaking with the correct person using two identifiers.   I discussed the limitations, risks, security and privacy concerns of performing an evaluation and management service by telephone and the availability of in person appointments. I also discussed with the patient that there may be a patient responsible charge related to this service. The patient expressed understanding and agreed to proceed.  Appointment Date: 02/07/2019  OBGYN Clinic: [facility]  Chief Complaint:  Postpartum Visit  History of Present Illness: LEONTINA SKIDMORE is a 33 y.o. Caucasian [id number] (Patient's last menstrual period was 03/31/2018.), seen for the above chief complaint.   She is s/p normal spontaneous vaginal delivery on 01/01/19 at 39.3 weeks; she was discharged to home on PPD#1. Pregnancy complicated by nothing. She had a 1.5 minute SD of a 9#7oz baby. Baby is doing well no sequela..  Complains of nothing  Vaginal bleeding or discharge: No  Mode of feeding infant: Bottle Intercourse: No  Contraception: abstinence PP depression s/s: No .  Any bowel or bladder issues: No  Pap smear: no abnormalities (date: 08/03/18)  Review of Systems: Positive for nothing. . Her 12 point review of systems is negative or as noted in the History of Present Illness.  Patient Active Problem List   Diagnosis Date Noted  . Shoulder dystocia, delivered, current hospitalization 01/02/2019  . Request for sterilization 11/03/2018  . Rubella non-immune status, antepartum 07/05/2018  . Pap smear of cervix shows high risk HPV present 07/04/2016  . Adjustment disorder with anxious mood 10/13/2013  . 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 Current Outpatient Medications  Medication Sig Dispense Refill  .  acetaminophen (TYLENOL) 325 MG tablet Take 2 tablets (650 mg total) by mouth every 6 (six) hours as needed (for pain scale < 4). 90 tablet 0  . ferrous sulfate 325 (65 FE) MG tablet Take 1 tablet (325 mg total) by mouth daily with breakfast. 90 tablet 0  . Prenatal MV & Min w/FA-DHA (PRENATAL ADULT GUMMY/DHA/FA PO) Take by mouth. Takes 2 daily     No current facility-administered medications for this visit.    Allergies Patient has no known allergies.  Physical Exam:  General:  Alert, oriented and cooperative.   Mental Status: Normal mood and affect perceived. Normal judgment and thought content.  Rest of physical exam deferred due to type of encounter  PP Depression Screening:   Edinburgh Postnatal Depression Scale - 02/07/19 1338      Edinburgh Postnatal Depression Scale:  In the Past 7 Days   I have been able to laugh and see the funny side of things.  0    I have looked forward with enjoyment to things.  0    I have blamed myself unnecessarily when things went wrong.  1    I have been anxious or worried for no good reason.  0    I have felt scared or panicky for no good reason.  0    Things have been getting on top of me.  0    I have been so unhappy that I have had difficulty sleeping.  0    I have felt sad or miserable.  0    I have been so unhappy that I have been crying.  0    The  thought of harming myself has occurred to me.  0    Edinburgh Postnatal Depression Scale Total  1       Assessment:Patient is a 33 y.o. [id number] who is 5 weeks postpartum from a normal spontaneous vaginal delivery.  She is doing well.   Plan: Start COCs this Sunday, B/U for 2 weeks Make appt w/surgeon when wants BTL  There are no diagnoses linked to this encounter.  RTC   I discussed the assessment and treatment plan with the patient. The patient was provided an opportunity to ask questions and all were answered. The patient agreed with the plan and demonstrated an understanding of the  instructions.   The patient was advised to call back or seek an in-person evaluation/go to the ED for any concerning postpartum symptoms.  I provided 10 minutes of non-face-to-face time during this encounter.   Jacklyn Shell, CNM [facility] for [employer], [facility]

## 2019-03-14 ENCOUNTER — Encounter: Payer: Self-pay | Admitting: Family Medicine

## 2020-01-30 ENCOUNTER — Telehealth: Payer: 59 | Admitting: Physician Assistant

## 2020-01-30 DIAGNOSIS — K047 Periapical abscess without sinus: Secondary | ICD-10-CM | POA: Diagnosis not present

## 2020-01-30 MED ORDER — CLINDAMYCIN HCL 150 MG PO CAPS: 300.0000 mg | ORAL_CAPSULE | Freq: Three times a day (TID) | ORAL | 0 refills | Status: DC

## 2020-01-30 NOTE — Progress Notes (Signed)
E-Visit for Dental Pain  We are sorry that you are not feeling well.  Here is how we plan to help!  Based on what you have shared with me in the questionnaire, it sounds like you have a dental infection.  Clindamycin 300mg  4 times per day for days  It is imperative that you see a dentist within 10 days of this eVisit to determine the cause of the dental pain and be sure it is adequately treated  A toothache or tooth pain is caused when the nerve in the root of a tooth or surrounding a tooth is irritated. Dental (tooth) infection, decay, injury, or loss of a tooth are the most common causes of dental pain. Pain may also occur after an extraction (tooth is pulled out). Pain sometimes originates from other areas and radiates to the jaw, thus appearing to be tooth pain.Bacteria growing inside your mouth can contribute to gum disease and dental decay, both of which can cause pain. A toothache occurs from inflammation of the central portion of the tooth called pulp. The pulp contains nerve endings that are very sensitive to pain. Inflammation to the pulp or pulpitis may be caused by dental cavities, trauma, and infection.    HOME CARE:   For toothaches: . Over-the-counter pain medications such as acetaminophen or ibuprofen may be used. Take these as directed on the package while you arrange for a dental appointment. . Avoid very cold or hot foods, because they may make the pain worse. . You may get relief from biting on a cotton ball soaked in oil of cloves. You can get oil of cloves at most drug stores.  For jaw pain: .  Aspirin may be helpful for problems in the joint of the jaw in adults. . If pain happens every time you open your mouth widely, the temporomandibular joint (TMJ) may be the source of the pain. Yawning or taking a large bite of food may worsen the pain. An appointment with your doctor or dentist will help you find the cause.     GET HELP RIGHT AWAY IF:  . You have a high fever  or chills . If you have had a recent head or face injury and develop headache, light headedness, nausea, vomiting, or other symptoms that concern you after an injury to your face or mouth, you could have a more serious injury in addition to your dental injury. . A facial rash associated with a toothache: This condition may improve with medication. Contact your doctor for them to decide what is appropriate. . Any jaw pain occurring with chest pain: Although jaw pain is most commonly caused by dental disease, it is sometimes referred pain from other areas. People with heart disease, especially people who have had stents placed, people with diabetes, or those who have had heart surgery may have jaw pain as a symptom of heart attack or angina. If your jaw or tooth pain is associated with lightheadedness, sweating, or shortness of breath, you should see a doctor as soon as possible. . Trouble swallowing or excessive pain or bleeding from gums: If you have a history of a weakened immune system, diabetes, or steroid use, you may be more susceptible to infections. Infections can often be more severe and extensive or caused by unusual organisms. Dental and gum infections in people with these conditions may require more aggressive treatment. An abscess may need draining or IV antibiotics, for example.  MAKE SURE YOU    Understand these instructions.  Will watch your condition.  Will get help right away if you are not doing well or get worse.  Thank you for choosing an e-visit. Your e-visit answers were reviewed by a board certified advanced clinical practitioner to complete your personal care plan. Depending upon the condition, your plan could have included both over the counter or prescription medications. Please review your pharmacy choice. Make sure the pharmacy is open so you can pick up prescription now. If there is a problem, you may contact your provider through [employer] and have the  prescription routed to another pharmacy. Your safety is important to [place]. If you have drug allergies check your prescription carefully.  For the next 24 hours you can use MyChart to ask questions about today's visit, request a non-urgent call back, or ask for a work or school excuse. You will get an email in the next two days asking about your experience. I hope that your e-visit has been valuable and will speed your recovery.  Greater than 5 minutes, yet less than 10 minutes of time have been spent researching, coordinating, and implementing care for this patient today

## 2020-05-10 ENCOUNTER — Encounter: Payer: Self-pay | Admitting: Family Medicine

## 2020-05-10 ENCOUNTER — Ambulatory Visit: Payer: 59 | Admitting: Family Medicine

## 2020-05-10 ENCOUNTER — Other Ambulatory Visit: Payer: Self-pay

## 2020-05-10 VITALS — BP 121/83 | HR 77 | Temp 96.8°F | Ht 62.5 in | Wt 202.6 lb

## 2020-05-10 DIAGNOSIS — Z0000 Encounter for general adult medical examination without abnormal findings: Secondary | ICD-10-CM | POA: Diagnosis not present

## 2020-05-10 DIAGNOSIS — Z30013 Encounter for initial prescription of injectable contraceptive: Secondary | ICD-10-CM | POA: Diagnosis not present

## 2020-05-10 DIAGNOSIS — R5383 Other fatigue: Secondary | ICD-10-CM | POA: Diagnosis not present

## 2020-05-10 DIAGNOSIS — J301 Allergic rhinitis due to pollen: Secondary | ICD-10-CM

## 2020-05-10 DIAGNOSIS — Z13220 Encounter for screening for lipoid disorders: Secondary | ICD-10-CM

## 2020-05-10 MED ORDER — MEDROXYPROGESTERONE ACETATE 150 MG/ML IM SUSP
150.0000 mg | Freq: Once | INTRAMUSCULAR | Status: AC
  Administered 2020-05-10: 150 mg via INTRAMUSCULAR

## 2020-05-10 MED ORDER — FLUTICASONE PROPIONATE 50 MCG/ACT NA SUSP: 2.0000 | Freq: Every day | NASAL | 1 refills | Status: DC

## 2020-05-10 MED ORDER — CETIRIZINE HCL 10 MG PO TABS: 10.0000 mg | ORAL_TABLET | Freq: Every day | ORAL | 1 refills | Status: DC

## 2020-05-10 NOTE — Patient Instructions (Addendum)
Preventive Care 53-33 Years Old, Female Preventive care refers to lifestyle choices and visits with your health care provider that can promote health and wellness. This includes:  A yearly physical exam. This is also called an annual wellness visit.  Regular dental and eye exams.  Immunizations.  Screening for certain conditions.  Healthy lifestyle choices, such as: ? Eating a healthy diet. ? Getting regular exercise. ? Not using drugs or products that contain nicotine and tobacco. ? Limiting alcohol use. What can I expect for my preventive care visit? Physical exam Your health care provider may check your:  Height and weight. These may be used to calculate your BMI (body mass index). BMI is a measurement that tells if you are at a healthy weight.  Contraceptive Injection A contraceptive injection is a shot that prevents pregnancy. It is also called a birth control shot. The shot contains the hormone progestin, which prevents pregnancy by: Stopping the ovaries from releasing eggs. Thickening cervical mucus to prevent sperm from entering the cervix. Thinning the lining of the uterus to prevent a fertilized egg from attaching to the uterus. Contraceptive injections are given under the skin (subcutaneous) or into a muscle (intramuscular). For these shots to work, you must get one of them every 3 months (12-13 weeks) from a health care provider. Tell a health care provider about: Any allergies you have. All medicines you are taking, including vitamins, herbs, eye drops, creams, and over-the-counter medicines. Any blood disorders you have. Any medical conditions you have. Whether you are pregnant or may be pregnant. What are the risks? Generally, this is a safe procedure. However, problems may occur, including: Mood changes or depression. Loss of bone density (osteoporosis) after long-term use. Blood clots. This is rare. Higher risk of an egg being fertilized outside your uterus  (ectopic pregnancy).This is rare. What happens before the procedure? Your health care provider may do a routine physical exam. You may have a test to make sure you are not pregnant. What happens during the procedure? The area where the shot will be given will be cleaned and sanitized with alcohol. A needle will be inserted into a muscle in your upper arm or buttock, or into the skin of your thigh or abdomen. The needle will be attached to a syringe with the medicine inside of it. The medicine will be pushed through the syringe and injected into your body. A small bandage (dressing) may be placed over the injection site.   What can I expect after the procedure? After the procedure, it is common to have: Soreness around the injection site for a couple of days. Irregular menstrual bleeding. Weight gain. Breast tenderness. Headaches. Discomfort in your abdomen. Ask your health care provider whether you need to use an added method of birth control (backup contraception), such as a condom, sponge, or spermicide. If the first shot is given 1-7 days after the start of your last menstrual period, you will not need backup contraception. If the first shot is given at any other time during your menstrual cycle, you should avoid having sex. If you do have sex, you will need to use backup contraception for 7 days after you receive the shot. Follow these instructions at home: General instructions Take over-the-counter and prescription medicines only as told by your health care provider. Do not rub or massage the injection site. Track your menstrual periods so you will know if they become irregular. Always use a condom to protect against sexually transmitted infections (STIs). Make sure  you schedule an appointment in time for your next shot and mark it on your calendar. You must get an injection every 3 months (12-13 weeks) to prevent pregnancy. Lifestyle Do not use any products that contain nicotine or  tobacco. These products include cigarettes, chewing tobacco, and vaping devices, such as e-cigarettes. If you need help quitting, ask your health care provider. Eat foods that are high in calcium and vitamin D, such as milk, cheese, and salmon. Doing this may help with any loss in bone density caused by the contraceptive injection. Ask your health care provider for dietary recommendations. Contact a health care provider if you: Have nausea or vomiting. Have abnormal vaginal discharge or bleeding. Miss a menstrual period or think you might be pregnant. Experience mood changes or depression. Feel dizzy or light-headed. Have leg pain. Get help right away if you: Have chest pain or cough up blood. Have shortness of breath. Have a severe headache that does not go away. Have numbness in any part of your body. Have slurred speech or vision problems. Have vaginal bleeding that is abnormally heavy or does not stop, or you have severe pain in your abdomen. Have depression that does not get better with treatment. If you ever feel like you may hurt yourself or others, or have thoughts about taking your own life, get help right away. Go to your nearest emergency department or: Call your local emergency services (911 in the [place] Call a suicide crisis helpline, such as the [facility] at 469-644-8746. This is open 24 hours a day in the [place] Text the Crisis Text Line at 323-797-5484 (in the [place].). Summary A contraceptive injection is a shot that prevents pregnancy. It is also called the birth control shot. The shot is given under the skin (subcutaneous) or into a muscle (intramuscular). After this procedure, it is common to have soreness around the injection site for a couple of days. To prevent pregnancy, the shot must be given by a health care provider every 3 months (12-13 weeks). After you have the shot, ask your health care provider whether you need to use an added method of  birth control (backup contraception), such as a condom, sponge, or spermicide. This information is not intended to replace advice given to you by your health care provider. Make sure you discuss any questions you have with your health care provider. Document Revised: 08/08/2019 Document Reviewed: 08/08/2019 Elsevier Patient Education  [place].  Heart rate and blood pressure.  Body temperature.  Skin for abnormal spots. Counseling Your health care provider may ask you questions about your:  Past medical problems.  Family's medical history.  Alcohol, tobacco, and drug use.  Emotional well-being.  Home life and relationship well-being.  Sexual activity.  Diet, exercise, and sleep habits.  Work and work Statistician.  Access to firearms.  Method of birth control.  Menstrual cycle.  Pregnancy history. What immunizations do I need? Vaccines are usually given at various ages, according to a schedule. Your health care provider will recommend vaccines for you based on your age, medical history, and lifestyle or other factors, such as travel or where you work.   What tests do I need? Blood tests  Lipid and cholesterol levels. These may be checked every 5 years starting at age 73.  Hepatitis C test.  Hepatitis B test. Screening  Diabetes screening. This is done by checking your blood sugar (glucose) after you have not eaten for a while (fasting).  STD (sexually  transmitted disease) testing, if you are at risk.  BRCA-related cancer screening. This may be done if you have a family history of breast, ovarian, tubal, or peritoneal cancers.  Pelvic exam and Pap test. This may be done every 3 years starting at age 41. Starting at age 88, this may be done every 5 years if you have a Pap test in combination with an HPV test. Talk with your health care provider about your test results, treatment options, and if necessary, the need for more tests.   Follow these  instructions at home: Eating and drinking  Eat a healthy diet that includes fresh fruits and vegetables, whole grains, lean protein, and low-fat dairy products.  Take vitamin and mineral supplements as recommended by your health care provider.  Do not drink alcohol if: ? Your health care provider tells you not to drink. ? You are pregnant, may be pregnant, or are planning to become pregnant.  If you drink alcohol: ? Limit how much you have to 0-1 drink a day. ? Be aware of how much alcohol is in your drink. In the [place] one drink equals one 12 oz bottle of beer (355 mL), one 5 oz glass of wine (148 mL), or one 1 oz glass of hard liquor (44 mL).   Lifestyle  Take daily care of your teeth and gums. Brush your teeth every morning and night with fluoride toothpaste. Floss one time each day.  Stay active. Exercise for at least 30 minutes 5 or more days each week.  Do not use any products that contain nicotine or tobacco, such as cigarettes, e-cigarettes, and chewing tobacco. If you need help quitting, ask your health care provider.  Do not use drugs.  If you are sexually active, practice safe sex. Use a condom or other form of protection to prevent STIs (sexually transmitted infections).  If you do not wish to become pregnant, use a form of birth control. If you plan to become pregnant, see your health care provider for a prepregnancy visit.  Find healthy ways to cope with stress, such as: ? Meditation, yoga, or listening to music. ? Journaling. ? Talking to a trusted person. ? Spending time with friends and family. Safety  Always wear your seat belt while driving or riding in a vehicle.  Do not drive: ? If you have been drinking alcohol. Do not ride with someone who has been drinking. ? When you are tired or distracted. ? While texting.  Wear a helmet and other protective equipment during sports activities.  If you have firearms in your house, make sure you follow all gun  safety procedures.  Seek help if you have been physically or sexually abused. What's next?  Go to your health care provider once a year for an annual wellness visit.  Ask your health care provider how often you should have your eyes and teeth checked.  Stay up to date on all vaccines. This information is not intended to replace advice given to you by your health care provider. Make sure you discuss any questions you have with your health care provider. Document Revised: 09/25/2019 Document Reviewed: 10/08/2017 Elsevier Patient Education  2021 [employer].

## 2020-05-10 NOTE — Progress Notes (Signed)
Patient ID: Mary [name], female    DOB: 08/02/1985, 35 y.o.   MRN: [id number]   Chief Complaint  Patient presents with  . Annual Exam   Subjective:  CC: physical without pap (last pap 2020= norma)  Presents today for annual wellness.  Last Pap smear done by GYN on August 03, 2018 with a normal result and negative HPV.  She is currently been on oral contraceptives, currently on menstrual cycle, wishes to change to Depo Provera injections today.  Last lab work done January 03, 2019, will get labs today.  No health concerns other than seasonal allergies.  Denies fever, chills, chest pain, shortness of breath no palpitations and no leg swelling.  Is a mother of 2 young children, and works full-time as a Engineer, civil (consulting).   The patient comes in today for a wellness visit.    A review of their health history was completed.  A review of medications was also completed.  Any needed refills; none  Eating habits: eats well   Falls/  MVA accidents in past few months: none  Regular exercise: tries to   Specialist pt sees on regular basis: none  Preventative health issues were discussed.   Additional concerns: fatigue and switching from birth control pill to Depo  Medical History Mary [name] has a past medical history of UTI (urinary tract infection) in pregnancy in first trimester (05/14/2012) and Wisdom teeth extracted (2007).   Outpatient Encounter Medications as of 05/10/2020  Medication Sig  . acetaminophen (TYLENOL) 325 MG tablet Take 2 tablets (650 mg total) by mouth every 6 (six) hours as needed (for pain scale < 4).  . cetirizine (ZYRTEC ALLERGY) 10 MG tablet Take 1 tablet (10 mg total) by mouth daily.  . fluticasone (FLONASE) 50 MCG/ACT nasal spray Place 2 sprays into both nostrils daily.  . [DISCONTINUED] clindamycin (CLEOCIN) 150 MG capsule Take 2 capsules (300 mg total) by mouth 3 (three) times daily. May dispense as 150mg  capsules  . [DISCONTINUED] ferrous sulfate 325 (65 FE) MG tablet  Take 1 tablet (325 mg total) by mouth daily with breakfast.  . [DISCONTINUED] norgestimate-ethinyl estradiol (ORTHO-CYCLEN) 0.25-35 MG-MCG tablet Take 1 tablet by mouth daily. (Patient not taking: Reported on 05/10/2020)  . [DISCONTINUED] Prenatal MV & Min w/FA-DHA (PRENATAL ADULT GUMMY/DHA/FA PO) Take by mouth. Takes 2 daily   Facility-Administered Encounter Medications as of 05/10/2020  Medication  . medroxyPROGESTERone (DEPO-PROVERA) injection 150 mg     Review of Systems  Constitutional: Negative for chills and fever.  Respiratory: Negative for cough and shortness of breath.   Cardiovascular: Negative for chest pain, palpitations and leg swelling.  Gastrointestinal: Negative for abdominal pain.  Skin: Negative for rash.  Neurological: Negative for headaches.     Vitals BP 121/83   Pulse 77   Temp (!) 96.8 F (36 C)   Ht 5' 2.5" (1.588 m)   Wt 202 lb 9.6 oz (91.9 kg)   SpO2 96%   BMI 36.47 kg/m   Objective:   Physical Exam Vitals reviewed.  Constitutional:      Appearance: Normal appearance.  HENT:     Right Ear: Tympanic membrane normal.     Left Ear: Tympanic membrane normal.     Nose: Rhinorrhea present.     Right Turbinates: Swollen.     Left Turbinates: Swollen.     Right Sinus: No maxillary sinus tenderness or frontal sinus tenderness.     Left Sinus: No maxillary sinus tenderness or frontal sinus tenderness.  Cardiovascular:     Rate and Rhythm: Normal rate and regular rhythm.     Heart sounds: Normal heart sounds.  Pulmonary:     Effort: Pulmonary effort is normal.     Breath sounds: Normal breath sounds.  Chest:     Comments: Breast exam deferred per patient preference. Does self-breast exams.  Abdominal:     General: Bowel sounds are normal.     Tenderness: There is no abdominal tenderness.  Genitourinary:    Comments: Pelvic exam deferred per patient preference.  Musculoskeletal:        General: Normal range of motion.  Skin:    General: Skin  is warm and dry.  Neurological:     General: No focal deficit present.     Mental Status: She is alert.  Psychiatric:        Behavior: Behavior normal.      Assessment and Plan   1. Wellness examination - Comprehensive Metabolic Panel (CMET)  2. Fatigue, unspecified type - CBC with Differential - TSH  3. Screening for cholesterol level - Lipid Profile  4. Encounter for initial prescription of injectable contraceptive - medroxyPROGESTERone (DEPO-PROVERA) injection 150 mg  5. Seasonal allergic rhinitis due to pollen - cetirizine (ZYRTEC ALLERGY) 10 MG tablet; Take 1 tablet (10 mg total) by mouth daily.  Dispense: 30 tablet; Refill: 1 - fluticasone (FLONASE) 50 MCG/ACT nasal spray; Place 2 sprays into both nostrils daily.  Dispense: 16 g; Refill: 1   Concerns: start depo injections  On Oral contraceptives, at end of menstrual cycle, negative home pregnancy test on Sunday: will start depo injection today in office.  Zyrtec and Flonase prescribed for seasonal allergic rhinitis.  Chronic conditions: none  Medication compliance: oral contraceptives daily, changing to depo today.  Well-controlled (no chest pain, no shortness of breath, no leg swelling, blood sugars controlled): Medication management interval: Monitors blood pressure: n/a Monitors blood sugars: n/a  Refills requested today: none.   Specialists:  none   Health Maintenance:  Mammogram: n/a Colonoscopy: n/a Pap smear: 08/03/18 normal.   Lab review:  Will get labs today after visit.  LDL: Triglycerides: A1C: Renal: Hepatic: TSH:   Lifestyle:   Diet and exercise: exercises twice per week, diet could be improved.  Alcohol consumption: none Smoking cessation: non smoker Sexually active: yes, on OCP- changing to depo Functional assessment: no  limitations Vision: due to go for annual exam Dental: every 6 months.   Safety measures recommended: seat belt use while in vehicle, safe sexual practices,  no illicit drug use, no excessive alcohol use. Diet and exercise/ lifestyle modifications discussed. Recommend 150 minutes per week of exercise such as walking. Recommend lots of fresh produce to include fruits, vegetables, beans, healthy fats such as avocado, nuts, seeds, and 3-6 ounces of protein at each meal.  Avoid fried foods, fast food, and processed carbohydrates. Limit alcohol consumption: no more than one drink per day for women and 2 drinks per day for men. Alcohol use is not appropriate with certain medications.  Stress management discussed. Mom of 3 kids, works full-time as a Engineer, civil (consulting).  Questions answered.    Agrees with plan of care discussed today. Understands warning signs to seek further care: chest pain, shortness of breath, any significant change in health.  Understands to follow-up in 3 months for nurse visit for depo injection. One year for annual. Will notify once lab results are available.     Novella Olive, NP 05/10/2020

## 2020-05-11 LAB — COMPREHENSIVE METABOLIC PANEL
ALT: 20 IU/L (ref 0–32)
AST: 19 IU/L (ref 0–40)
Albumin/Globulin Ratio: 1.7 (ref 1.2–2.2)
Albumin: 4.5 g/dL (ref 3.8–4.8)
Alkaline Phosphatase: 64 IU/L (ref 44–121)
BUN/Creatinine Ratio: 11 (ref 9–23)
BUN: 8 mg/dL (ref 6–20)
Bilirubin Total: 0.2 mg/dL (ref 0.0–1.2)
CO2: 20 mmol/L (ref 20–29)
Calcium: 9.2 mg/dL (ref 8.7–10.2)
Chloride: 105 mmol/L (ref 96–106)
Creatinine, Ser: 0.71 mg/dL (ref 0.57–1.00)
Globulin, Total: 2.6 g/dL (ref 1.5–4.5)
Glucose: 83 mg/dL (ref 65–99)
Potassium: 4.5 mmol/L (ref 3.5–5.2)
Sodium: 142 mmol/L (ref 134–144)
Total Protein: 7.1 g/dL (ref 6.0–8.5)
eGFR: 116 mL/min/{1.73_m2} (ref >59)

## 2020-05-11 LAB — CBC WITH DIFFERENTIAL/PLATELET
Basophils Absolute: 0.1 10*3/uL (ref 0.0–0.2)
Basos: 1 %
EOS (ABSOLUTE): 0.1 10*3/uL (ref 0.0–0.4)
Eos: 1 %
Hematocrit: 39.2 % (ref 34.0–46.6)
Hemoglobin: 13.2 g/dL (ref 11.1–15.9)
Immature Grans (Abs): 0 10*3/uL (ref 0.0–0.1)
Immature Granulocytes: 0 %
Lymphocytes Absolute: 2.3 10*3/uL (ref 0.7–3.1)
Lymphs: 22 %
MCH: 30 pg (ref 26.6–33.0)
MCHC: 33.7 g/dL (ref 31.5–35.7)
MCV: 89 fL (ref 79–97)
Monocytes Absolute: 0.4 10*3/uL (ref 0.1–0.9)
Monocytes: 4 %
Neutrophils Absolute: 7.3 10*3/uL — ABNORMAL HIGH (ref 1.4–7.0)
Neutrophils: 72 %
Platelets: 291 10*3/uL (ref 150–450)
RBC: 4.4 x10E6/uL (ref 3.77–5.28)
RDW: 12.6 % (ref 11.7–15.4)
WBC: 10.2 10*3/uL (ref 3.4–10.8)

## 2020-05-11 LAB — LIPID PANEL
Chol/HDL Ratio: 3.1 ratio (ref 0.0–4.4)
Cholesterol, Total: 150 mg/dL (ref 100–199)
HDL: 48 mg/dL (ref >39)
LDL Chol Calc (NIH): 88 mg/dL (ref 0–99)
Triglycerides: 73 mg/dL (ref 0–149)
VLDL Cholesterol Cal: 14 mg/dL (ref 5–40)

## 2020-05-11 LAB — TSH: TSH: 2.72 u[IU]/mL (ref 0.450–4.500)

## 2020-08-08 ENCOUNTER — Other Ambulatory Visit: Payer: Self-pay

## 2020-08-08 ENCOUNTER — Other Ambulatory Visit (INDEPENDENT_AMBULATORY_CARE_PROVIDER_SITE_OTHER): Payer: 59 | Admitting: *Deleted

## 2020-08-08 DIAGNOSIS — Z3042 Encounter for surveillance of injectable contraceptive: Secondary | ICD-10-CM | POA: Diagnosis not present

## 2020-08-08 MED ORDER — MEDROXYPROGESTERONE ACETATE 150 MG/ML IM SUSP
150.0000 mg | Freq: Once | INTRAMUSCULAR | Status: AC
  Administered 2020-08-08: 150 mg via INTRAMUSCULAR

## 2020-09-16 ENCOUNTER — Emergency Department (HOSPITAL_COMMUNITY)
Admission: EM | Admit: 2020-09-16 | Discharge: 2020-09-16 | Disposition: A | Discharge status: Home / Self Care | Payer: 59 | Attending: Emergency Medicine | Admitting: Emergency Medicine

## 2020-09-16 ENCOUNTER — Other Ambulatory Visit: Payer: Self-pay

## 2020-09-16 ENCOUNTER — Encounter (HOSPITAL_COMMUNITY): Payer: Self-pay

## 2020-09-16 ENCOUNTER — Emergency Department (HOSPITAL_COMMUNITY): Payer: 59

## 2020-09-16 DIAGNOSIS — K8020 Calculus of gallbladder without cholecystitis without obstruction: Secondary | ICD-10-CM | POA: Insufficient documentation

## 2020-09-16 DIAGNOSIS — R9431 Abnormal electrocardiogram [ECG] [EKG]: Secondary | ICD-10-CM | POA: Diagnosis not present

## 2020-09-16 DIAGNOSIS — R109 Unspecified abdominal pain: Secondary | ICD-10-CM | POA: Diagnosis present

## 2020-09-16 DIAGNOSIS — M549 Dorsalgia, unspecified: Secondary | ICD-10-CM | POA: Diagnosis not present

## 2020-09-16 DIAGNOSIS — N9489 Other specified conditions associated with female genital organs and menstrual cycle: Secondary | ICD-10-CM | POA: Insufficient documentation

## 2020-09-16 LAB — CBC
HCT: 38.8 % (ref 36.0–46.0)
Hemoglobin: 13.1 g/dL (ref 12.0–15.0)
MCH: 30.3 pg (ref 26.0–34.0)
MCHC: 33.8 g/dL (ref 30.0–36.0)
MCV: 89.6 fL (ref 80.0–100.0)
Platelets: 273 10*3/uL (ref 150–400)
RBC: 4.33 MIL/uL (ref 3.87–5.11)
RDW: 12 % (ref 11.5–15.5)
WBC: 10 10*3/uL (ref 4.0–10.5)
nRBC: 0 % (ref 0.0–0.2)

## 2020-09-16 LAB — COMPREHENSIVE METABOLIC PANEL
ALT: 27 U/L (ref 0–44)
AST: 20 U/L (ref 15–41)
Albumin: 4.3 g/dL (ref 3.5–5.0)
Alkaline Phosphatase: 68 U/L (ref 38–126)
Anion gap: 6 (ref 5–15)
BUN: 16 mg/dL (ref 6–20)
CO2: 24 mmol/L (ref 22–32)
Calcium: 9.2 mg/dL (ref 8.9–10.3)
Chloride: 108 mmol/L (ref 98–111)
Creatinine, Ser: 0.85 mg/dL (ref 0.44–1.00)
GFR, Estimated: >60 mL/min (ref >60)
Glucose, Bld: 117 mg/dL — ABNORMAL HIGH (ref 70–99)
Potassium: 3.6 mmol/L (ref 3.5–5.1)
Sodium: 138 mmol/L (ref 135–145)
Total Bilirubin: 0.3 mg/dL (ref 0.3–1.2)
Total Protein: 8 g/dL (ref 6.5–8.1)

## 2020-09-16 LAB — URINALYSIS, ROUTINE W REFLEX MICROSCOPIC
Bilirubin Urine: NEGATIVE
Glucose, UA: NEGATIVE mg/dL
Ketones, ur: NEGATIVE mg/dL
Nitrite: NEGATIVE
Protein, ur: NEGATIVE mg/dL
Specific Gravity, Urine: 1.008 (ref 1.005–1.030)
WBC, UA: >50 WBC/hpf — ABNORMAL HIGH (ref 0–5)
pH: 6 (ref 5.0–8.0)

## 2020-09-16 LAB — LIPASE, BLOOD: Lipase: 34 U/L (ref 11–51)

## 2020-09-16 LAB — D-DIMER, QUANTITATIVE: D-Dimer, Quant: 0.44 ug/mL-FEU (ref 0.00–0.50)

## 2020-09-16 LAB — HCG, QUANTITATIVE, PREGNANCY: hCG, Beta Chain, Quant, S: <1 m[IU]/mL (ref <5)

## 2020-09-16 MED ORDER — ONDANSETRON 4 MG PO TBDP: 4.0000 mg | ORAL_TABLET | Freq: Three times a day (TID) | ORAL | 0 refills | Status: DC | PRN

## 2020-09-16 MED ORDER — ONDANSETRON HCL 4 MG/2ML IJ SOLN
4.0000 mg | Freq: Once | INTRAMUSCULAR | Status: AC
  Administered 2020-09-16: 4 mg via INTRAVENOUS
  Filled 2020-09-16: qty 2

## 2020-09-16 MED ORDER — SODIUM CHLORIDE 0.9 % IV BOLUS
1000.0000 mL | Freq: Once | INTRAVENOUS | Status: AC
  Administered 2020-09-16: 1000 mL via INTRAVENOUS

## 2020-09-16 MED ORDER — HYDROCODONE-ACETAMINOPHEN 5-325 MG PO TABS: 1.0000 | ORAL_TABLET | ORAL | 0 refills | Status: DC | PRN

## 2020-09-16 MED ORDER — IOHEXOL 350 MG/ML SOLN
80.0000 mL | Freq: Once | INTRAVENOUS | Status: AC | PRN
  Administered 2020-09-16: 80 mL via INTRAVENOUS

## 2020-09-16 MED ORDER — FENTANYL CITRATE PF 50 MCG/ML IJ SOSY
50.0000 ug | PREFILLED_SYRINGE | Freq: Once | INTRAMUSCULAR | Status: AC
  Administered 2020-09-16: 50 ug via INTRAVENOUS
  Filled 2020-09-16: qty 1

## 2020-09-16 NOTE — ED Notes (Signed)
Pt returned from CT Scan 

## 2020-09-16 NOTE — ED Notes (Signed)
Patient transported to CT 

## 2020-09-16 NOTE — ED Triage Notes (Signed)
Pt reports right sided abd pain that started tonight, pt says she has pain in back as well.

## 2020-09-16 NOTE — Discharge Instructions (Addendum)
As we discussed you have gallstones.  You likely need to have your gallbladder out.  Call the number to schedule an ultrasound of your gallbladder later today or tomorrow.  Avoid fatty and greasy foods.  Follow-up with Dr. Lovell Sheehan in the office early next week for further discussion of gallbladder surgery.  Avoid fatty greasy foods. Referred to the ED with worsening pain, fever, vomiting, any other concerns.

## 2020-09-16 NOTE — ED Provider Notes (Signed)
[facility] EMERGENCY DEPARTMENT Provider Note   CSN: [id number] Arrival date & time: 09/16/20  7106     History Chief Complaint  Patient presents with   Abdominal Pain    Mary [name] is a 35 y.o. female.  Patient woke up from sleep about 12:30 AM with right-sided abdominal pain and mid back pain.  Pain is constant but waxes and wanes in severity, never goes away completely.  Nausea but no vomiting.  Has never had this pain in the past.  No pain with urination or blood in the urine.  No diarrhea.  No vaginal bleeding or discharge.  No chest pain or shortness of breath.  No cough or fever.  Still has appendix and gallbladder. Reports her mother has gallbladder problems  The history is provided by the patient.  Abdominal Pain Associated symptoms: nausea   Associated symptoms: no cough, no dysuria, no fever, no hematuria, no shortness of breath and no vomiting       Past Medical History:  Diagnosis Date   UTI (urinary tract infection) in pregnancy in first trimester 05/14/2012   Wisdom teeth extracted 2007    Patient Active Problem List   Diagnosis Date Noted   Fatigue 05/10/2020   Encounter for initial prescription of injectable contraceptive 05/10/2020   Shoulder dystocia, delivered, current hospitalization 01/02/2019   Request for sterilization 11/03/2018   Rubella non-immune status, antepartum 07/05/2018   Screening for cholesterol level 05/14/2018   Pap smear of cervix shows high risk HPV present 07/04/2016   Adjustment disorder with anxious mood 10/13/2013   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    Past Surgical History:  Procedure Laterality Date   NO PAST SURGERIES     WISDOM TOOTH EXTRACTION       OB History     Gravida  4   Para  2   Term  2   Preterm      AB  2   Living  2      SAB  2   IAB      Ectopic      Multiple  0   Live Births  2           Family History  Problem Relation Age of Onset   Diabetes Mother    Hypertension Mother     Asthma Father    COPD Father    Stroke Maternal Grandfather    Cancer Maternal Grandfather     Social History   Tobacco Use   Smoking status: Never   Smokeless tobacco: Never  Vaping Use   Vaping Use: Never used  Substance Use Topics   Alcohol use: No   Drug use: No    Home Medications Prior to Admission medications   Medication Sig Start Date End Date Taking? Authorizing Provider  acetaminophen (TYLENOL) 325 MG tablet Take 2 tablets (650 mg total) by mouth every 6 (six) hours as needed (for pain scale < 4). 01/03/19   Fair, Hoyle Sauer, MD  cetirizine (ZYRTEC ALLERGY) 10 MG tablet Take 1 tablet (10 mg total) by mouth daily. 05/10/20   Novella Olive, NP  fluticasone (FLONASE) 50 MCG/ACT nasal spray Place 2 sprays into both nostrils daily. 05/10/20   Novella Olive, NP    Allergies    Patient has no known allergies.  Review of Systems   Review of Systems  Constitutional:  Negative for activity change, appetite change and fever.  HENT:  Negative for congestion and rhinorrhea.   Respiratory:  Negative for cough, chest tightness and shortness of breath.   Gastrointestinal:  Positive for abdominal pain and nausea. Negative for vomiting.  Genitourinary:  Negative for dysuria and hematuria.  Musculoskeletal:  Positive for back pain.  Skin:  Negative for rash.  Neurological:  Negative for dizziness, weakness and headaches.   all other systems are negative except as noted in the HPI and PMH.   Physical Exam Updated Vital Signs BP (!) 143/84 (BP Location: Right Arm)   Pulse 83   Temp 98.4 F (36.9 C) (Oral)   Resp 18   Ht 5\' 2"  (1.575 m)   Wt 97.1 kg   SpO2 99%   BMI 39.14 kg/m   Physical Exam Vitals and nursing note reviewed.  Constitutional:      General: She is not in acute distress.    Appearance: She is well-developed.  HENT:     Head: Normocephalic and atraumatic.     Mouth/Throat:     Pharynx: No oropharyngeal exudate.  Eyes:     Conjunctiva/sclera:  Conjunctivae normal.     Pupils: Pupils are equal, round, and reactive to light.  Neck:     Comments: No meningismus. Cardiovascular:     Rate and Rhythm: Normal rate and regular rhythm.     Heart sounds: Normal heart sounds. No murmur heard. Pulmonary:     Effort: Pulmonary effort is normal. No respiratory distress.     Breath sounds: Normal breath sounds.  Abdominal:     Palpations: Abdomen is soft.     Tenderness: There is abdominal tenderness. There is no guarding or rebound.     Comments: TTP RUQ. Soft.  Musculoskeletal:        General: Tenderness present. Normal range of motion.     Cervical back: Normal range of motion and neck supple.     Comments: Mid R back pain. No CVAT  Skin:    General: Skin is warm.  Neurological:     Mental Status: She is alert and oriented to person, place, and time.     Cranial Nerves: No cranial nerve deficit.     Motor: No abnormal muscle tone.     Coordination: Coordination normal.     Comments:  5/5 strength throughout. CN 2-12 intact.Equal grip strength.   Psychiatric:        Behavior: Behavior normal.    ED Results / Procedures / Treatments   Labs (all labs ordered are listed, but only abnormal results are displayed) Labs Reviewed  COMPREHENSIVE METABOLIC PANEL - Abnormal; Notable for the following components:      Result Value   Glucose, Bld 117 (*)    All other components within normal limits  URINALYSIS, ROUTINE W REFLEX MICROSCOPIC - Abnormal; Notable for the following components:   APPearance HAZY (*)    Hgb urine dipstick SMALL (*)    Leukocytes,Ua TRACE (*)    WBC, UA >50 (*)    Bacteria, UA RARE (*)    All other components within normal limits  URINE CULTURE  LIPASE, BLOOD  CBC  HCG, QUANTITATIVE, PREGNANCY  D-DIMER, QUANTITATIVE    EKG EKG Interpretation  Date/Time:  Sunday September 16 2020 04:04:06 EDT Ventricular Rate:  89 PR Interval:  135 QRS Duration: 104 QT Interval:  358 QTC Calculation: 436 R  Axis:   -9 Text Interpretation: Sinus rhythm Low voltage, precordial leads Nonspecific T abnormalities, anterior leads No previous ECGs available Confirmed by 09[birth year]1970 [phone]) on 09/16/2020 4:20:13 AM  Radiology CT ABDOMEN  PELVIS W CONTRAST  Result Date: 09/16/2020 CLINICAL DATA:  Right upper quadrant and back pain EXAM: CT ABDOMEN AND PELVIS WITH CONTRAST TECHNIQUE: Multidetector CT imaging of the abdomen and pelvis was performed using the standard protocol following bolus administration of intravenous contrast. CONTRAST:  85mL OMNIPAQUE IOHEXOL 350 MG/ML SOLN COMPARISON:  None. FINDINGS: Lower chest:  No contributory findings. Hepatobiliary: No focal liver abnormality.Multiple lamellated gallstones. No pericholecystic edema noted. No bile duct dilatation. Pancreas: Unremarkable. Spleen: Unremarkable. Adrenals/Urinary Tract: Negative adrenals. No hydronephrosis or stone. Unremarkable bladder. Stomach/Bowel:  No obstruction. No appendicitis. Vascular/Lymphatic: No acute vascular abnormality. No mass or adenopathy. Reproductive:No pathologic findings. Other: No ascites or pneumoperitoneum. Musculoskeletal: No acute abnormalities. IMPRESSION: 1. No acute finding. 2. Multiple gallstones. Electronically Signed   By: Marnee Spring M.D.   On: 09/16/2020 05:45    Procedures Procedures   Medications Ordered in ED Medications  fentaNYL (SUBLIMAZE) injection 50 mcg (has no administration in time range)  sodium chloride 0.9 % bolus 1,000 mL (has no administration in time range)  ondansetron (ZOFRAN) injection 4 mg (has no administration in time range)    ED Course  I have reviewed the triage vital signs and the nursing notes.  Pertinent labs & imaging results that were available during my care of the patient were reviewed by me and considered in my medical decision making (see chart for details).    MDM Rules/Calculators/A&P                           Right upper abdominal pain with nausea  since 12:30 AM.  Concerning for cholelithiasis or cholecystitis  LFTs and lipase are normal.  No fever or leukocytosis.  Abdomen is soft without peritoneal signs. UA does show pyuria.  She denies any urinary symptoms.  Will send for culture.  Ultrasound not available.  CT does show multiple gallstones without evidence of cholecystitis.  Patient feels improved on recheck.  Discussed that the gallstones are likely the source of her pain.  She should follow-up with general surgery for further evaluation of her gallbladder.  We will schedule outpatient ultrasound.  Avoid fatty and greasy foods.  Discussed pain and nausea control at home with follow-up ultrasound and general surgery.  Return precautions discussed.   Final Clinical Impression(s) / ED Diagnoses Final diagnoses:  Gallstones    Rx / DC Orders ED Discharge Orders     None        Brenleigh Collet, Jeannett Senior, MD 09/16/20 [phone]

## 2020-09-17 ENCOUNTER — Ambulatory Visit (HOSPITAL_COMMUNITY)
Admission: RE | Admit: 2020-09-17 | Discharge: 2020-09-17 | Disposition: A | Discharge status: Home / Self Care | Payer: 59 | Source: Ambulatory Visit | Attending: Emergency Medicine | Admitting: Emergency Medicine

## 2020-09-17 DIAGNOSIS — K8020 Calculus of gallbladder without cholecystitis without obstruction: Secondary | ICD-10-CM | POA: Diagnosis not present

## 2020-09-17 LAB — URINE CULTURE

## 2020-09-18 ENCOUNTER — Ambulatory Visit: Payer: 59 | Admitting: General Surgery

## 2020-09-18 ENCOUNTER — Encounter: Payer: Self-pay | Admitting: General Surgery

## 2020-09-18 ENCOUNTER — Other Ambulatory Visit: Payer: Self-pay

## 2020-09-18 VITALS — BP 124/79 | HR 72 | Temp 98.4°F | Resp 16 | Ht 62.0 in | Wt 216.2 lb

## 2020-09-18 DIAGNOSIS — K8020 Calculus of gallbladder without cholecystitis without obstruction: Secondary | ICD-10-CM | POA: Diagnosis not present

## 2020-09-18 NOTE — Progress Notes (Signed)
Mary [name]; [id number]; 10/26/1985   HPI Patient is a 35 year old white female who was referred to my care by Dr. Ladona Ridgel for evaluation and treatment of right upper quadrant abdominal pain secondary to cholelithiasis.  Patient has had right upper quadrant abdominal pain with radiation around to her right flank for approximately 2 weeks.  It is not resolving.  She has been seen in the emergency room.  She does have fatty food intolerance.  No fever, chills, or jaundice have been noted. Past Medical History:  Diagnosis Date   UTI (urinary tract infection) in pregnancy in first trimester 05/14/2012   Wisdom teeth extracted 2007    Past Surgical History:  Procedure Laterality Date   NO PAST SURGERIES     WISDOM TOOTH EXTRACTION      Family History  Problem Relation Age of Onset   Diabetes Mother    Hypertension Mother    Asthma Father    COPD Father    Stroke Maternal Grandfather    Cancer Maternal Grandfather     Current Outpatient Medications on File Prior to Visit  Medication Sig Dispense Refill   cetirizine (ZYRTEC ALLERGY) 10 MG tablet Take 1 tablet (10 mg total) by mouth daily. (Patient taking differently: Take 10 mg by mouth daily as needed for allergies.) 30 tablet 1   fluticasone (FLONASE) 50 MCG/ACT nasal spray Place 2 sprays into both nostrils daily. (Patient taking differently: Place 2 sprays into both nostrils daily as needed for allergies.) 16 g 1   HYDROcodone-acetaminophen (NORCO/VICODIN) 5-325 MG tablet Take 1 tablet by mouth every 4 (four) hours as needed. 10 tablet 0   ondansetron (ZOFRAN ODT) 4 MG disintegrating tablet Take 1 tablet (4 mg total) by mouth every 8 (eight) hours as needed for nausea or vomiting. 20 tablet 0   No current facility-administered medications on file prior to visit.    No Known Allergies  Social History   Substance and Sexual Activity  Alcohol Use No    Social History   Tobacco Use  Smoking Status Never  Smokeless Tobacco Never     Review of Systems  Constitutional: Negative.   HENT: Negative.    Eyes: Negative.   Respiratory: Negative.    Cardiovascular: Negative.   Gastrointestinal:  Positive for abdominal pain, heartburn and nausea.  Genitourinary: Negative.   Musculoskeletal: Negative.   Skin: Negative.   Neurological: Negative.   Endo/Heme/Allergies: Negative.   Psychiatric/Behavioral: Negative.     Objective   Vitals:   09/18/20 0851  BP: 124/79  Pulse: 72  Resp: 16  Temp: 98.4 F (36.9 C)  SpO2: 98%    Physical Exam Vitals reviewed.  Constitutional:      Appearance: Normal appearance. She is not ill-appearing.  HENT:     Head: Normocephalic and atraumatic.  Eyes:     General: No scleral icterus. Cardiovascular:     Rate and Rhythm: Normal rate and regular rhythm.     Heart sounds: Normal heart sounds. No murmur heard.   No friction rub. No gallop.  Pulmonary:     Effort: Pulmonary effort is normal. No respiratory distress.     Breath sounds: Normal breath sounds. No stridor. No wheezing, rhonchi or rales.  Abdominal:     General: Bowel sounds are normal. There is no distension.     Palpations: Abdomen is soft. There is no mass.     Tenderness: There is abdominal tenderness. There is no guarding or rebound.     Hernia: No hernia  is present.     Comments: Tender on the right upper quadrant to deep palpation.  No rigidity is noted.  Skin:    General: Skin is warm and dry.  Neurological:     Mental Status: She is alert and oriented to person, place, and time.   Ultrasound shows cholelithiasis with a normal common bile duct. Liver tests are within normal limits. Assessment  Cholecystitis, cholelithiasis Plan  Patient is scheduled for laparoscopic cholecystectomy on 09/21/2020.  The risks and benefits of the procedure including bleeding, infection, hepatobiliary injury, and the possibility of an open procedure were fully explained to the patient, who gave informed consent.

## 2020-09-18 NOTE — Patient Instructions (Signed)
Mary [name]  09/18/2020     @   Your procedure is scheduled on 09/21/2020.   Report to [facility] at  0700  A.M.   Call this number if you have problems the morning of surgery:  [phone]   Remember:  Do not eat or drink after midnight.      Take these medicines the morning of surgery with A SIP OF WATER    hydrocodone (if needed), zofran (if needed).     Do not wear jewelry, make-up or nail polish.  Do not wear lotions, powders, or perfumes, or deodorant.  Do not shave 48 hours prior to surgery.  Men may shave face and neck.  Do not bring valuables to the hospital.  [facility] is not responsible for any belongings or valuables.  Contacts, dentures or bridgework may not be worn into surgery.  Leave your suitcase in the car.  After surgery it may be brought to your room.  For patients admitted to the hospital, discharge time will be determined by your treatment team.  Patients discharged the day of surgery will not be allowed to drive home and must have someone with them for 24 hours.    Special instructions:  DO NOT smoke tobacco or vape for 24 hours before your procedure.  Please read over the following fact sheets that you were given. Coughing and Deep Breathing, Surgical Site Infection Prevention, Anesthesia Post-op Instructions, and Care and Recovery After Surgery      Minimally Invasive Cholecystectomy, Care After This sheet gives you information about how to care for yourself after your procedure. Your health care provider may also give you more specific instructions. If you have problems or questions, contact your health careprovider. What can I expect after the procedure? After the procedure, it is common to have: Pain at your incision sites. You will be given medicines to control this pain. Mild nausea or vomiting. Bloating and possible shoulder pain from the gas that was used during the procedure. Follow these  instructions at home: Medicines Take over-the-counter and prescription medicines only as told by your health care provider. If you were prescribed an antibiotic medicine, take or use it as told by your health care provider. Do not stop using the antibiotic even if you start to feel better. Ask your health care provider if the medicine prescribed to you: Requires you to avoid driving or using machinery. Can cause constipation. You may need to take these actions to prevent or treat constipation: Drink enough fluid to keep your urine pale yellow. Take over-the-counter or prescription medicines. Eat foods that are high in fiber, such as beans, whole grains, and fresh fruits and vegetables. Limit foods that are high in fat and processed sugars, such as fried or sweet foods. Incision care  Follow instructions from your health care provider about how to take care of your incisions. Make sure you: Wash your hands with soap and water for at least 20 seconds before and after you change your bandage (dressing). If soap and water are not available, use hand sanitizer. Change your dressing as told by your health care provider. Leave stitches (sutures), skin glue, or adhesive strips in place. These skin closures may need to be in place for 2 weeks or longer. If adhesive strip edges start to loosen and curl up, you may trim the loose edges. Do not remove adhesive strips completely unless your health care provider tells you to  do that. Do not take baths, swim, or use a hot tub until your health care provider approves. Ask your health care provider if you may take showers. You may only be allowed to take sponge baths. Check your incision area every day for signs of infection. Check for: More redness, swelling, or pain. Fluid or blood. Warmth. Pus or a bad smell.  Activity Rest as told by your health care provider. Avoid sitting for a long time without moving. Get up to take short walks every 1-2 hours. This  is important to improve blood flow and breathing. Ask for help if you feel weak or unsteady. Do not lift anything that is heavier than 10 lb (4.5 kg), or the limit that you are told, until your health care provider says that it is safe. Do not play contact sports until your health care provider approves. Do not return to work or school until your health care provider approves. Return to your normal activities as told by your health care provider. Ask your health care provider what activities are safe for you. General instructions If you were given a sedative during the procedure, it can affect you for several hours. Do not drive or operate machinery until your health care provider says that it is safe. Keep all follow-up visits as told by your health care provider. This is important. Contact a health care provider if: You develop a rash. You have more redness, swelling, or pain around your incisions. You have fluid or blood coming from your incisions. Your incisions feel warm to the touch. You have pus or a bad smell coming from your incisions. You have a fever. One or more of your incisions breaks open. Get help right away if: You have trouble breathing. You have chest pain. You have increasing pain in your shoulders. You faint or feel dizzy when you stand. You have severe pain in your abdomen. You have nausea or vomiting that lasts for more than one day. You have leg pain. Summary After your procedure, it is common to have pain at the incision sites. You may also have nausea or bloating. Follow your health care provider's instructions about medicine, activity restrictions, and caring for your incision areas. Do not do activities that require a lot of effort. Contact a health care provider if you have a fever or other signs of infection, such as more redness, swelling, or pain around the incisions. Get help right away if you have chest pain, increasing pain in the shoulders, or trouble  breathing. This information is not intended to replace advice given to you by your health care provider. Make sure you discuss any questions you have with your healthcare provider. Document Revised: 10/27/2018 Document Reviewed: 10/27/2018 Elsevier Patient Education  2022 Elsevier Inc.  General Anesthesia, Adult, Care After This sheet gives you information about how to care for yourself after your procedure. Your health care provider may also give you more specific instructions. If you have problems or questions, contact your health careprovider. What can I expect after the procedure? After the procedure, the following side effects are common: Pain or discomfort at the IV site. Nausea. Vomiting. Sore throat. Trouble concentrating. Feeling cold or chills. Feeling weak or tired. Sleepiness and fatigue. Soreness and body aches. These side effects can affect parts of the body that were not involved in surgery. Follow these instructions at home: For the time period you were told by your health care provider:  Rest. Do not participate in activities where you  could fall or become injured. Do not drive or use machinery. Do not drink alcohol. Do not take sleeping pills or medicines that cause drowsiness. Do not make important decisions or sign legal documents. Do not take care of children on your own.  Eating and drinking Follow any instructions from your health care provider about eating or drinking restrictions. When you feel hungry, start by eating small amounts of foods that are soft and easy to digest (bland), such as toast. Gradually return to your regular diet. Drink enough fluid to keep your urine pale yellow. If you vomit, rehydrate by drinking water, juice, or clear broth. General instructions If you have sleep apnea, surgery and certain medicines can increase your risk for breathing problems. Follow instructions from your health care provider about wearing your sleep  device: Anytime you are sleeping, including during daytime naps. While taking prescription pain medicines, sleeping medicines, or medicines that make you drowsy. Have a responsible adult stay with you for the time you are told. It is important to have someone help care for you until you are awake and alert. Return to your normal activities as told by your health care provider. Ask your health care provider what activities are safe for you. Take over-the-counter and prescription medicines only as told by your health care provider. If you smoke, do not smoke without supervision. Keep all follow-up visits as told by your health care provider. This is important. Contact a health care provider if: You have nausea or vomiting that does not get better with medicine. You cannot eat or drink without vomiting. You have pain that does not get better with medicine. You are unable to pass urine. You develop a skin rash. You have a fever. You have redness around your IV site that gets worse. Get help right away if: You have difficulty breathing. You have chest pain. You have blood in your urine or stool, or you vomit blood. Summary After the procedure, it is common to have a sore throat or nausea. It is also common to feel tired. Have a responsible adult stay with you for the time you are told. It is important to have someone help care for you until you are awake and alert. When you feel hungry, start by eating small amounts of foods that are soft and easy to digest (bland), such as toast. Gradually return to your regular diet. Drink enough fluid to keep your urine pale yellow. Return to your normal activities as told by your health care provider. Ask your health care provider what activities are safe for you. This information is not intended to replace advice given to you by your health care provider. Make sure you discuss any questions you have with your healthcare provider. Document Revised:  10/13/2019 Document Reviewed: 05/12/2019 Elsevier Patient Education  2022 Elsevier Inc. How to Use Chlorhexidine for Bathing Chlorhexidine gluconate (CHG) is a germ-killing (antiseptic) solution that is used to clean the skin. It can get rid of the bacteria that normally live on the skin and can keep them away for about 24 hours. To clean your skin with CHG, you may be given: A CHG solution to use in the shower or as part of a sponge bath. A prepackaged cloth that contains CHG. Cleaning your skin with CHG may help lower the risk for infection: While you are staying in the intensive care unit of the hospital. If you have a vascular access, such as a central line, to provide short-term or long-term access to  your veins. If you have a catheter to drain urine from your bladder. If you are on a ventilator. A ventilator is a machine that helps you breathe by moving air in and out of your lungs. After surgery. What are the risks? Risks of using CHG include: A skin reaction. Hearing loss, if CHG gets in your ears. Eye injury, if CHG gets in your eyes and is not rinsed out. The CHG product catching fire. Make sure that you avoid smoking and flames after applying CHG to your skin. Do not use CHG: If you have a chlorhexidine allergy or have previously reacted to chlorhexidine. On babies younger than 60 months of age. How to use CHG solution Use CHG only as told by your health care provider, and follow the instructions on the label. Use the full amount of CHG as directed. Usually, this is one bottle. During a shower Follow these steps when using CHG solution during a shower (unless your health care provider gives you different instructions): Start the shower. Use your normal soap and shampoo to wash your face and hair. Turn off the shower or move out of the shower stream. Pour the CHG onto a clean washcloth. Do not use any type of brush or rough-edged sponge. Starting at your neck, lather your  body down to your toes. Make sure you follow these instructions: If you will be having surgery, pay special attention to the part of your body where you will be having surgery. Scrub this area for at least 1 minute. Do not use CHG on your head or face. If the solution gets into your ears or eyes, rinse them well with water. Avoid your genital area. Avoid any areas of skin that have broken skin, cuts, or scrapes. Scrub your back and under your arms. Make sure to wash skin folds. Let the lather sit on your skin for 1-2 minutes or as long as told by your health care provider. Thoroughly rinse your entire body in the shower. Make sure that all body creases and crevices are rinsed well. Dry off with a clean towel. Do not put any substances on your body afterward--such as powder, lotion, or perfume--unless you are told to do so by your health care provider. Only use lotions that are recommended by the manufacturer. Put on clean clothes or pajamas. If it is the night before your surgery, sleep in clean sheets.  During a sponge bath Follow these steps when using CHG solution during a sponge bath (unless your health care provider gives you different instructions): Use your normal soap and shampoo to wash your face and hair. Pour the CHG onto a clean washcloth. Starting at your neck, lather your body down to your toes. Make sure you follow these instructions: If you will be having surgery, pay special attention to the part of your body where you will be having surgery. Scrub this area for at least 1 minute. Do not use CHG on your head or face. If the solution gets into your ears or eyes, rinse them well with water. Avoid your genital area. Avoid any areas of skin that have broken skin, cuts, or scrapes. Scrub your back and under your arms. Make sure to wash skin folds. Let the lather sit on your skin for 1-2 minutes or as long as told by your health care provider. Using a different clean, wet washcloth,  thoroughly rinse your entire body. Make sure that all body creases and crevices are rinsed well. Dry off with a  clean towel. Do not put any substances on your body afterward--such as powder, lotion, or perfume--unless you are told to do so by your health care provider. Only use lotions that are recommended by the manufacturer. Put on clean clothes or pajamas. If it is the night before your surgery, sleep in clean sheets. How to use CHG prepackaged cloths Only use CHG cloths as told by your health care provider, and follow the instructions on the label. Use the CHG cloth on clean, dry skin. Do not use the CHG cloth on your head or face unless your health care provider tells you to. When washing with the CHG cloth: Avoid your genital area. Avoid any areas of skin that have broken skin, cuts, or scrapes. Before surgery Follow these steps when using a CHG cloth to clean before surgery (unless your health care provider gives you different instructions): Using the CHG cloth, vigorously scrub the part of your body where you will be having surgery. Scrub using a back-and-forth motion for 3 minutes. The area on your body should be completely wet with CHG when you are done scrubbing. Do not rinse. Discard the cloth and let the area air-dry. Do not put any substances on the area afterward, such as powder, lotion, or perfume. Put on clean clothes or pajamas. If it is the night before your surgery, sleep in clean sheets.  For general bathing Follow these steps when using CHG cloths for general bathing (unless your health care provider gives you different instructions). Use a separate CHG cloth for each area of your body. Make sure you wash between any folds of skin and between your fingers and toes. Wash your body in the following order, switching to a new cloth after each step: The front of your neck, shoulders, and chest. Both of your arms, under your arms, and your hands. Your stomach and groin area,  avoiding the genitals. Your right leg and foot. Your left leg and foot. The back of your neck, your back, and your buttocks. Do not rinse. Discard the cloth and let the area air-dry. Do not put any substances on your body afterward--such as powder, lotion, or perfume--unless you are told to do so by your health care provider. Only use lotions that are recommended by the manufacturer. Put on clean clothes or pajamas. Contact a health care provider if: Your skin gets irritated after scrubbing. You have questions about using your solution or cloth. Get help right away if: Your eyes become very red or swollen. Your eyes itch badly. Your skin itches badly and is red or swollen. Your hearing changes. You have trouble seeing. You have swelling or tingling in your mouth or throat. You have trouble breathing. You swallow any chlorhexidine. Summary Chlorhexidine gluconate (CHG) is a germ-killing (antiseptic) solution that is used to clean the skin. Cleaning your skin with CHG may help to lower your risk for infection. You may be given CHG to use for bathing. It may be in a bottle or in a prepackaged cloth to use on your skin. Carefully follow your health care provider's instructions and the instructions on the product label. Do not use CHG if you have a chlorhexidine allergy. Contact your health care provider if your skin gets irritated after scrubbing. This information is not intended to replace advice given to you by your health care provider. Make sure you discuss any questions you have with your healthcare provider. Document Revised: 06/10/2019 Document Reviewed: 07/15/2019 Elsevier Patient Education  2022 [employer].

## 2020-09-18 NOTE — H&P (Signed)
Mary [name]; [id number]; 07/05/1985   HPI Patient is a 35-year-old white female who was referred to my care by Dr. Taylor for evaluation and treatment of right upper quadrant abdominal pain secondary to cholelithiasis.  Patient has had right upper quadrant abdominal pain with radiation around to her right flank for approximately 2 weeks.  It is not resolving.  She has been seen in the emergency room.  She does have fatty food intolerance.  No fever, chills, or jaundice have been noted. Past Medical History:  Diagnosis Date   UTI (urinary tract infection) in pregnancy in first trimester 05/14/2012   Wisdom teeth extracted 2007    Past Surgical History:  Procedure Laterality Date   NO PAST SURGERIES     WISDOM TOOTH EXTRACTION      Family History  Problem Relation Age of Onset   Diabetes Mother    Hypertension Mother    Asthma Father    COPD Father    Stroke Maternal Grandfather    Cancer Maternal Grandfather     Current Outpatient Medications on File Prior to Visit  Medication Sig Dispense Refill   cetirizine (ZYRTEC ALLERGY) 10 MG tablet Take 1 tablet (10 mg total) by mouth daily. (Patient taking differently: Take 10 mg by mouth daily as needed for allergies.) 30 tablet 1   fluticasone (FLONASE) 50 MCG/ACT nasal spray Place 2 sprays into both nostrils daily. (Patient taking differently: Place 2 sprays into both nostrils daily as needed for allergies.) 16 g 1   HYDROcodone-acetaminophen (NORCO/VICODIN) 5-325 MG tablet Take 1 tablet by mouth every 4 (four) hours as needed. 10 tablet 0   ondansetron (ZOFRAN ODT) 4 MG disintegrating tablet Take 1 tablet (4 mg total) by mouth every 8 (eight) hours as needed for nausea or vomiting. 20 tablet 0   No current facility-administered medications on file prior to visit.    No Known Allergies  Social History   Substance and Sexual Activity  Alcohol Use No    Social History   Tobacco Use  Smoking Status Never  Smokeless Tobacco Never     Review of Systems  Constitutional: Negative.   HENT: Negative.    Eyes: Negative.   Respiratory: Negative.    Cardiovascular: Negative.   Gastrointestinal:  Positive for abdominal pain, heartburn and nausea.  Genitourinary: Negative.   Musculoskeletal: Negative.   Skin: Negative.   Neurological: Negative.   Endo/Heme/Allergies: Negative.   Psychiatric/Behavioral: Negative.     Objective   Vitals:   09/18/20 0851  BP: 124/79  Pulse: 72  Resp: 16  Temp: 98.4 F (36.9 C)  SpO2: 98%    Physical Exam Vitals reviewed.  Constitutional:      Appearance: Normal appearance. She is not ill-appearing.  HENT:     Head: Normocephalic and atraumatic.  Eyes:     General: No scleral icterus. Cardiovascular:     Rate and Rhythm: Normal rate and regular rhythm.     Heart sounds: Normal heart sounds. No murmur heard.   No friction rub. No gallop.  Pulmonary:     Effort: Pulmonary effort is normal. No respiratory distress.     Breath sounds: Normal breath sounds. No stridor. No wheezing, rhonchi or rales.  Abdominal:     General: Bowel sounds are normal. There is no distension.     Palpations: Abdomen is soft. There is no mass.     Tenderness: There is abdominal tenderness. There is no guarding or rebound.     Hernia: No hernia   is present.     Comments: Tender on the right upper quadrant to deep palpation.  No rigidity is noted.  Skin:    General: Skin is warm and dry.  Neurological:     Mental Status: She is alert and oriented to person, place, and time.   Ultrasound shows cholelithiasis with a normal common bile duct. Liver tests are within normal limits. Assessment  Cholecystitis, cholelithiasis Plan  Patient is scheduled for laparoscopic cholecystectomy on 09/21/2020.  The risks and benefits of the procedure including bleeding, infection, hepatobiliary injury, and the possibility of an open procedure were fully explained to the patient, who gave informed consent.

## 2020-09-19 ENCOUNTER — Encounter (HOSPITAL_COMMUNITY)
Admission: RE | Admit: 2020-09-19 | Discharge: 2020-09-19 | Disposition: A | Discharge status: Home / Self Care | Payer: 59 | Source: Ambulatory Visit | Attending: General Surgery | Admitting: General Surgery

## 2020-09-21 ENCOUNTER — Ambulatory Visit (HOSPITAL_COMMUNITY)
Admission: RE | Admit: 2020-09-21 | Discharge: 2020-09-21 | Disposition: A | Discharge status: Home / Self Care | Payer: 59 | Attending: General Surgery | Admitting: General Surgery

## 2020-09-21 ENCOUNTER — Encounter (HOSPITAL_COMMUNITY): Payer: Self-pay | Admitting: General Surgery

## 2020-09-21 ENCOUNTER — Ambulatory Visit (HOSPITAL_COMMUNITY): Payer: 59 | Admitting: Certified Registered"

## 2020-09-21 ENCOUNTER — Encounter (HOSPITAL_COMMUNITY)
Admission: RE | Disposition: A | Discharge status: Home / Self Care | Payer: Self-pay | Source: Home / Self Care | Attending: General Surgery

## 2020-09-21 DIAGNOSIS — K8020 Calculus of gallbladder without cholecystitis without obstruction: Secondary | ICD-10-CM | POA: Diagnosis not present

## 2020-09-21 DIAGNOSIS — Z6841 Body Mass Index (BMI) 40.0 and over, adult: Secondary | ICD-10-CM | POA: Diagnosis not present

## 2020-09-21 DIAGNOSIS — K8012 Calculus of gallbladder with acute and chronic cholecystitis without obstruction: Secondary | ICD-10-CM | POA: Diagnosis not present

## 2020-09-21 DIAGNOSIS — K8010 Calculus of gallbladder with chronic cholecystitis without obstruction: Secondary | ICD-10-CM | POA: Diagnosis not present

## 2020-09-21 DIAGNOSIS — K8070 Calculus of gallbladder and bile duct without cholecystitis without obstruction: Secondary | ICD-10-CM | POA: Diagnosis not present

## 2020-09-21 HISTORY — PX: CHOLECYSTECTOMY: SHX55

## 2020-09-21 SURGERY — LAPAROSCOPIC CHOLECYSTECTOMY
Anesthesia: General | Site: Abdomen

## 2020-09-21 MED ORDER — ROCURONIUM BROMIDE 10 MG/ML (PF) SYRINGE
PREFILLED_SYRINGE | INTRAVENOUS | Status: DC | PRN
  Administered 2020-09-21: 40 mg via INTRAVENOUS

## 2020-09-21 MED ORDER — LACTATED RINGERS IV SOLN: INTRAVENOUS | Status: DC

## 2020-09-21 MED ORDER — KETOROLAC TROMETHAMINE 30 MG/ML IJ SOLN
INTRAMUSCULAR | Status: AC
  Filled 2020-09-21: qty 1

## 2020-09-21 MED ORDER — CEFAZOLIN SODIUM-DEXTROSE 2-4 GM/100ML-% IV SOLN
2.0000 g | INTRAVENOUS | Status: AC
  Administered 2020-09-21: 2 g via INTRAVENOUS
  Filled 2020-09-21: qty 100

## 2020-09-21 MED ORDER — KETOROLAC TROMETHAMINE 30 MG/ML IJ SOLN: 30.0000 mg | Freq: Once | INTRAMUSCULAR | Status: DC | PRN

## 2020-09-21 MED ORDER — LIDOCAINE HCL (PF) 2 % IJ SOLN
INTRAMUSCULAR | Status: AC
  Filled 2020-09-21: qty 5

## 2020-09-21 MED ORDER — CHLORHEXIDINE GLUCONATE CLOTH 2 % EX PADS: 6.0000 | MEDICATED_PAD | Freq: Once | CUTANEOUS | Status: DC

## 2020-09-21 MED ORDER — DEXAMETHASONE SODIUM PHOSPHATE 10 MG/ML IJ SOLN
INTRAMUSCULAR | Status: AC
  Filled 2020-09-21: qty 1

## 2020-09-21 MED ORDER — MIDAZOLAM HCL 2 MG/2ML IJ SOLN
INTRAMUSCULAR | Status: AC
  Filled 2020-09-21: qty 2

## 2020-09-21 MED ORDER — MEPERIDINE HCL 50 MG/ML IJ SOLN: 6.2500 mg | INTRAMUSCULAR | Status: DC | PRN

## 2020-09-21 MED ORDER — DEXAMETHASONE SODIUM PHOSPHATE 10 MG/ML IJ SOLN
INTRAMUSCULAR | Status: DC | PRN
  Administered 2020-09-21: 10 mg via INTRAVENOUS

## 2020-09-21 MED ORDER — MIDAZOLAM HCL 5 MG/5ML IJ SOLN
INTRAMUSCULAR | Status: DC | PRN
  Administered 2020-09-21: 2 mg via INTRAVENOUS

## 2020-09-21 MED ORDER — HYDROCODONE-ACETAMINOPHEN 7.5-325 MG PO TABS: 1.0000 | ORAL_TABLET | Freq: Once | ORAL | Status: DC | PRN

## 2020-09-21 MED ORDER — SODIUM CHLORIDE 0.9 % IR SOLN
Status: DC | PRN
  Administered 2020-09-21: 1000 mL

## 2020-09-21 MED ORDER — HYDROMORPHONE HCL 1 MG/ML IJ SOLN
INTRAMUSCULAR | Status: AC
  Filled 2020-09-21: qty 0.5

## 2020-09-21 MED ORDER — PROPOFOL 10 MG/ML IV BOLUS
INTRAVENOUS | Status: AC
  Filled 2020-09-21: qty 20

## 2020-09-21 MED ORDER — SUGAMMADEX SODIUM 200 MG/2ML IV SOLN
INTRAVENOUS | Status: DC | PRN
  Administered 2020-09-21: 200 mg via INTRAVENOUS

## 2020-09-21 MED ORDER — ONDANSETRON HCL 4 MG/2ML IJ SOLN
INTRAMUSCULAR | Status: DC | PRN
  Administered 2020-09-21: 4 mg via INTRAVENOUS

## 2020-09-21 MED ORDER — BUPIVACAINE LIPOSOME 1.3 % IJ SUSP
INTRAMUSCULAR | Status: DC | PRN
  Administered 2020-09-21: 20 mL

## 2020-09-21 MED ORDER — ONDANSETRON HCL 4 MG/2ML IJ SOLN
INTRAMUSCULAR | Status: AC
  Filled 2020-09-21: qty 2

## 2020-09-21 MED ORDER — METOCLOPRAMIDE HCL 5 MG/ML IJ SOLN
INTRAMUSCULAR | Status: DC | PRN
  Administered 2020-09-21: 10 mg via INTRAVENOUS

## 2020-09-21 MED ORDER — HYDROMORPHONE HCL 1 MG/ML IJ SOLN
0.2500 mg | INTRAMUSCULAR | Status: DC | PRN
  Administered 2020-09-21: 0.5 mg via INTRAVENOUS

## 2020-09-21 MED ORDER — LIDOCAINE HCL (CARDIAC) PF 100 MG/5ML IV SOSY
PREFILLED_SYRINGE | INTRAVENOUS | Status: DC | PRN
  Administered 2020-09-21: 100 mg via INTRATRACHEAL

## 2020-09-21 MED ORDER — DEXMEDETOMIDINE (PRECEDEX) IN NS 20 MCG/5ML (4 MCG/ML) IV SYRINGE
PREFILLED_SYRINGE | INTRAVENOUS | Status: DC | PRN
  Administered 2020-09-21: 20 ug via INTRAVENOUS

## 2020-09-21 MED ORDER — HEMOSTATIC AGENTS (NO CHARGE) OPTIME
TOPICAL | Status: DC | PRN
  Administered 2020-09-21: 1 via TOPICAL

## 2020-09-21 MED ORDER — FENTANYL CITRATE (PF) 100 MCG/2ML IJ SOLN
INTRAMUSCULAR | Status: DC | PRN
  Administered 2020-09-21 (×2): 50 ug via INTRAVENOUS
  Administered 2020-09-21: 150 ug via INTRAVENOUS

## 2020-09-21 MED ORDER — FENTANYL CITRATE (PF) 250 MCG/5ML IJ SOLN
INTRAMUSCULAR | Status: AC
  Filled 2020-09-21: qty 5

## 2020-09-21 MED ORDER — SUCCINYLCHOLINE CHLORIDE 200 MG/10ML IV SOSY
PREFILLED_SYRINGE | INTRAVENOUS | Status: DC | PRN
  Administered 2020-09-21: 160 mg via INTRAVENOUS

## 2020-09-21 MED ORDER — ROCURONIUM BROMIDE 10 MG/ML (PF) SYRINGE
PREFILLED_SYRINGE | INTRAVENOUS | Status: AC
  Filled 2020-09-21: qty 10

## 2020-09-21 MED ORDER — BUPIVACAINE LIPOSOME 1.3 % IJ SUSP
INTRAMUSCULAR | Status: AC
  Filled 2020-09-21: qty 20

## 2020-09-21 MED ORDER — KETOROLAC TROMETHAMINE 30 MG/ML IJ SOLN
30.0000 mg | Freq: Once | INTRAMUSCULAR | Status: AC
  Administered 2020-09-21: 30 mg via INTRAVENOUS

## 2020-09-21 MED ORDER — SUCCINYLCHOLINE CHLORIDE 200 MG/10ML IV SOSY
PREFILLED_SYRINGE | INTRAVENOUS | Status: AC
  Filled 2020-09-21: qty 10

## 2020-09-21 MED ORDER — ONDANSETRON HCL 4 MG/2ML IJ SOLN: 4.0000 mg | Freq: Once | INTRAMUSCULAR | Status: DC | PRN

## 2020-09-21 MED ORDER — PROPOFOL 10 MG/ML IV BOLUS
INTRAVENOUS | Status: DC | PRN
  Administered 2020-09-21: 200 mg via INTRAVENOUS

## 2020-09-21 MED ORDER — HYDROCODONE-ACETAMINOPHEN 5-325 MG PO TABS: 1.0000 | ORAL_TABLET | ORAL | 0 refills | Status: DC | PRN

## 2020-09-21 SURGICAL SUPPLY — 39 items
ADH SKN CLS APL DERMABOND .7 (GAUZE/BANDAGES/DRESSINGS) ×1
APL PRP STRL LF DISP 70% ISPRP (MISCELLANEOUS) ×1
APPLIER CLIP ROT 10 11.4 M/L (STAPLE) ×2
APR CLP MED LRG 11.4X10 (STAPLE) ×1
BAG RETRIEVAL 10 (BASKET) ×1
CHLORAPREP W/TINT 26 (MISCELLANEOUS) ×2 IMPLANT
CLIP APPLIE ROT 10 11.4 M/L (STAPLE) ×1 IMPLANT
CLOTH BEACON ORANGE TIMEOUT ST (SAFETY) ×2 IMPLANT
COVER LIGHT HANDLE STERIS (MISCELLANEOUS) ×4 IMPLANT
DERMABOND ADVANCED (GAUZE/BANDAGES/DRESSINGS) ×1
DERMABOND ADVANCED .7 DNX12 (GAUZE/BANDAGES/DRESSINGS) ×1 IMPLANT
ELECT REM PT RETURN 9FT ADLT (ELECTROSURGICAL) ×2
ELECTRODE REM PT RTRN 9FT ADLT (ELECTROSURGICAL) ×1 IMPLANT
GLOVE SURG SS PI 7.5 STRL IVOR (GLOVE) ×2 IMPLANT
GLOVE SURG UNDER POLY LF SZ7 (GLOVE) ×4 IMPLANT
GOWN STRL REUS W/TWL LRG LVL3 (GOWN DISPOSABLE) ×6 IMPLANT
HEMOSTAT SNOW SURGICEL 2X4 (HEMOSTASIS) ×2 IMPLANT
INST SET LAPROSCOPIC AP (KITS) ×2 IMPLANT
KIT TURNOVER KIT A (KITS) ×2 IMPLANT
MANIFOLD NEPTUNE II (INSTRUMENTS) ×2 IMPLANT
NEEDLE HYPO 18GX1.5 BLUNT FILL (NEEDLE) ×2 IMPLANT
NEEDLE HYPO 21X1.5 SAFETY (NEEDLE) ×2 IMPLANT
NEEDLE INSUFFLATION 14GA 120MM (NEEDLE) ×2 IMPLANT
NS IRRIG 1000ML POUR BTL (IV SOLUTION) ×2 IMPLANT
PACK LAP CHOLE LZT030E (CUSTOM PROCEDURE TRAY) ×2 IMPLANT
PAD ARMBOARD 7.5X6 YLW CONV (MISCELLANEOUS) ×2 IMPLANT
SET BASIN LINEN APH (SET/KITS/TRAYS/PACK) ×2 IMPLANT
SET TUBE SMOKE EVAC HIGH FLOW (TUBING) ×2 IMPLANT
SLEEVE ENDOPATH XCEL 5M (ENDOMECHANICALS) ×2 IMPLANT
SUT MNCRL AB 4-0 PS2 18 (SUTURE) ×4 IMPLANT
SUT VICRYL 0 UR6 27IN ABS (SUTURE) ×2 IMPLANT
SYR 20ML LL LF (SYRINGE) ×4 IMPLANT
SYS BAG RETRIEVAL 10MM (BASKET) ×1
SYSTEM BAG RETRIEVAL 10MM (BASKET) ×1 IMPLANT
TROCAR ENDO BLADELESS 11MM (ENDOMECHANICALS) ×2 IMPLANT
TROCAR XCEL NON-BLD 5MMX100MML (ENDOMECHANICALS) ×2 IMPLANT
TROCAR XCEL UNIV SLVE 11M 100M (ENDOMECHANICALS) ×2 IMPLANT
TUBE CONNECTING 12X1/4 (SUCTIONS) ×2 IMPLANT
WARMER LAPAROSCOPE (MISCELLANEOUS) ×2 IMPLANT

## 2020-09-21 NOTE — Anesthesia Procedure Notes (Signed)
Procedure Name: Intubation Date/Time: 09/21/2020 8:41 AM Performed by: Brynda Peon, CRNA Pre-anesthesia Checklist: Patient identified, Emergency Drugs available, Suction available, Patient being monitored and Timeout performed Patient Re-evaluated:Patient Re-evaluated prior to induction Oxygen Delivery Method: Circle system utilized Preoxygenation: Pre-oxygenation with 100% oxygen Induction Type: IV induction Laryngoscope Size: Miller and 2 Grade View: Grade I Tube type: Oral Tube size: 7.0 mm Number of attempts: 1 Airway Equipment and Method: Stylet Placement Confirmation: ETT inserted through vocal cords under direct vision, positive ETCO2 and CO2 detector Secured at: 21 cm Tube secured with: Tape Dental Injury: Teeth and Oropharynx as per pre-operative assessment

## 2020-09-21 NOTE — Op Note (Signed)
Patient:  Mary [name]  DOB:  [birth year]  MRN:  [id number]   Preop Diagnosis: Biliary colic, cholelithiasis  Postop Diagnosis: Same  Procedure: Laparoscopic cholecystectomy  Surgeon: Franky Macho, MD  Anes: General endotracheal  Indications: Patient is a 35 year old white female who presents with biliary colic secondary to cholelithiasis.  The risks and benefits of the procedure including bleeding, infection, hepatobiliary injury, and the possibility of an open procedure were fully explained to the patient, who gave informed consent.  Procedure note: The patient was placed in the supine position.  After induction of general endotracheal anesthesia, the abdomen was prepped and draped using the usual sterile technique with ChloraPrep.  Surgical site confirmation was performed.  A supraumbilical incision was made down to the fascia.  Veress needle was introduced into the abdominal cavity and confirmation of placement was done using the saline drop test.  The abdomen was then insufflated to 15 mmHg pressure.  An 11 mm trocar was introduced into the abdominal cavity under direct visualization without difficulty.  The patient was placed in reverse Trendelenburg position and an additional 11 mm trocar was placed in the epigastric region and 5 mm trochars were placed the right upper quadrant and right flank regions.  Liver was inspected and noted to be within normal limits.  The gallbladder was retracted in a dynamic fashion in order to provide a critical view of the triangle of Calot.  The cystic duct was first identified.  Its juncture to the infundibulum was fully identified.  Endoclips were placed proximally and distally on the cystic duct, and the cystic duct was divided.  This was likewise done to the cystic artery.  The gallbladder was freed away from the gallbladder fossa using Bovie electrocautery.  The gallbladder was delivered through the epigastric trocar site using an Endo Catch bag.  The  gallbladder fossa was inspected and no abnormal bleeding or bile leakage was noted.  Surgicel was placed in the gallbladder fossa.  All fluid and air were then evacuated from the abdominal cavity prior to removal of the trochars.  All wounds were irrigated with normal saline.  All wounds were injected with Exparel.  The epigastric fascia was reapproximated using an 0 Vicryl interrupted suture.  All skin incisions were closed using a 4-0 Monocryl subcuticular suture.  Dermabond was applied.  All tape and needle counts were correct at the end of the procedure.  The patient was extubated in the operating room and transferred to PACU in stable condition.  Complications: None  EBL: Minimal  Specimen: Gallbladder

## 2020-09-21 NOTE — Anesthesia Preprocedure Evaluation (Addendum)
Anesthesia Evaluation  Patient identified by MRN, date of birth, ID band Patient awake    Reviewed: Allergy & Precautions, NPO status , Patient's Chart, lab work & pertinent test results  Airway Mallampati: II  TM Distance: >3 FB Neck ROM: Full    Dental   Upper right first molar chipped:   Pulmonary neg pulmonary ROS,    Pulmonary exam normal breath sounds clear to auscultation       Cardiovascular negative cardio ROS Normal cardiovascular exam Rhythm:Regular Rate:Normal     Neuro/Psych negative neurological ROS  negative psych ROS   GI/Hepatic negative GI ROS, Neg liver ROS,   Endo/Other  negative endocrine ROS  Renal/GU negative Renal ROS  negative genitourinary   Musculoskeletal negative musculoskeletal ROS (+)   Abdominal   Peds negative pediatric ROS (+)  Hematology negative hematology ROS (+)   Anesthesia Other Findings Morbid obesity with BMI 40  Reproductive/Obstetrics negative OB ROS                            Anesthesia Physical Anesthesia Plan  ASA: 3  Anesthesia Plan: General   Post-op Pain Management:    Induction: Intravenous  PONV Risk Score and Plan:   Airway Management Planned: Nasal Cannula  Additional Equipment:   Intra-op Plan:   Post-operative Plan: Extubation in OR  Informed Consent: I have reviewed the patients History and Physical, chart, labs and discussed the procedure including the risks, benefits and alternatives for the proposed anesthesia with the patient or authorized representative who has indicated his/her understanding and acceptance.     Dental advisory given  Plan Discussed with: CRNA  Anesthesia Plan Comments: (ASA 3 for BMI 40)        Anesthesia Quick Evaluation

## 2020-09-21 NOTE — Interval H&P Note (Signed)
History and Physical Interval Note:  09/21/2020 8:14 AM  Mary [name]  has presented today for surgery, with the diagnosis of Cholelithiasis.  The various methods of treatment have been discussed with the patient and family. After consideration of risks, benefits and other options for treatment, the patient has consented to  Procedure(s): LAPAROSCOPIC CHOLECYSTECTOMY (N/A) as a surgical intervention.  The patient's history has been reviewed, patient examined, no change in status, stable for surgery.  I have reviewed the patient's chart and labs.  Questions were answered to the patient's satisfaction.     Franky Macho

## 2020-09-21 NOTE — Anesthesia Postprocedure Evaluation (Signed)
Anesthesia Post Note  Patient: Mary [name]  Procedure(s) Performed: LAPAROSCOPIC CHOLECYSTECTOMY (Abdomen)  Patient location during evaluation: PACU Anesthesia Type: General Level of consciousness: awake and alert Pain management: pain level controlled Vital Signs Assessment: post-procedure vital signs reviewed and stable Respiratory status: spontaneous breathing, nonlabored ventilation, respiratory function stable and patient connected to nasal cannula oxygen Cardiovascular status: blood pressure returned to baseline and stable Postop Assessment: no apparent nausea or vomiting Anesthetic complications: no   No notable events documented.   Last Vitals:  Vitals:   09/21/20 0945 09/21/20 1000  BP: 113/64 108/71  Pulse: 68 68  Resp: 11 15  Temp:    SpO2: 99% 97%    Last Pain:  Vitals:   09/21/20 0945  TempSrc:   PainSc: 2                  Shona Needles

## 2020-09-21 NOTE — Transfer of Care (Signed)
Immediate Anesthesia Transfer of Care Note  Patient: Mary [name]  Procedure(s) Performed: LAPAROSCOPIC CHOLECYSTECTOMY (Abdomen)  Patient Location: PACU  Anesthesia Type:General  Level of Consciousness: awake, alert , oriented, patient cooperative and responds to stimulation  Airway & Oxygen Therapy: Patient Spontanous Breathing  Post-op Assessment: Report given to RN, Post -op Vital signs reviewed and stable and Patient moving all extremities X 4  Post vital signs: Reviewed and stable  Last Vitals:  Vitals Value Taken Time  BP 108/64 09/21/20 0924  Temp    Pulse 79 09/21/20 0925  Resp 14 09/21/20 0925  SpO2 97 % 09/21/20 0925  Vitals shown include unvalidated device data.  Last Pain:  Vitals:   09/21/20 0723  TempSrc: Oral  PainSc: 0-No pain      Patients Stated Pain Goal: 7 (09/21/20 0723)  Complications: No notable events documented.

## 2020-09-24 ENCOUNTER — Telehealth: Payer: Self-pay | Admitting: Family Medicine

## 2020-09-24 ENCOUNTER — Encounter (HOSPITAL_COMMUNITY): Payer: Self-pay | Admitting: General Surgery

## 2020-09-24 LAB — SURGICAL PATHOLOGY

## 2020-09-24 NOTE — Telephone Encounter (Signed)
FMLA ([employer]) paperwork filled out and faxed to [phone] and received confirmation.  Out of work for surgery from 09/21/2020  (date of surgery) and may return without restrictions on 10/08/2020.

## 2020-09-27 ENCOUNTER — Telehealth (INDEPENDENT_AMBULATORY_CARE_PROVIDER_SITE_OTHER): Payer: 59 | Admitting: General Surgery

## 2020-09-27 DIAGNOSIS — Z09 Encounter for follow-up examination after completed treatment for conditions other than malignant neoplasm: Secondary | ICD-10-CM

## 2020-09-27 NOTE — Telephone Encounter (Signed)
Postoperative telephone visit performed with patient.  She states she is doing well and has no complaints.  She was told to follow-up with me as needed.  As this was a part of the global surgical fee, this was not a billable visit.  Total telephone time was 1 minute.

## 2020-10-31 ENCOUNTER — Other Ambulatory Visit: Payer: Self-pay

## 2020-10-31 ENCOUNTER — Other Ambulatory Visit (INDEPENDENT_AMBULATORY_CARE_PROVIDER_SITE_OTHER): Payer: 59

## 2020-10-31 DIAGNOSIS — Z3042 Encounter for surveillance of injectable contraceptive: Secondary | ICD-10-CM

## 2020-10-31 DIAGNOSIS — Z30013 Encounter for initial prescription of injectable contraceptive: Secondary | ICD-10-CM

## 2020-10-31 MED ORDER — MEDROXYPROGESTERONE ACETATE 150 MG/ML IM SUSP
150.0000 mg | Freq: Once | INTRAMUSCULAR | Status: AC
  Administered 2020-10-31: 150 mg via INTRAMUSCULAR

## 2021-01-25 ENCOUNTER — Other Ambulatory Visit: Payer: Self-pay

## 2021-01-25 ENCOUNTER — Other Ambulatory Visit (INDEPENDENT_AMBULATORY_CARE_PROVIDER_SITE_OTHER): Payer: 59

## 2021-01-25 DIAGNOSIS — Z30013 Encounter for initial prescription of injectable contraceptive: Secondary | ICD-10-CM | POA: Diagnosis not present

## 2021-01-25 MED ORDER — MEDROXYPROGESTERONE ACETATE 150 MG/ML IM SUSP
150.0000 mg | Freq: Once | INTRAMUSCULAR | Status: AC
  Administered 2021-01-25: 150 mg via INTRAMUSCULAR

## 2021-02-13 DIAGNOSIS — Z20822 Contact with and (suspected) exposure to covid-19: Secondary | ICD-10-CM | POA: Diagnosis not present

## 2021-02-14 DIAGNOSIS — Z20822 Contact with and (suspected) exposure to covid-19: Secondary | ICD-10-CM | POA: Diagnosis not present

## 2021-02-15 DIAGNOSIS — Z20822 Contact with and (suspected) exposure to covid-19: Secondary | ICD-10-CM | POA: Diagnosis not present

## 2021-04-17 ENCOUNTER — Other Ambulatory Visit (INDEPENDENT_AMBULATORY_CARE_PROVIDER_SITE_OTHER): Payer: 59 | Admitting: *Deleted

## 2021-04-17 ENCOUNTER — Other Ambulatory Visit: Payer: Self-pay

## 2021-04-17 DIAGNOSIS — Z3042 Encounter for surveillance of injectable contraceptive: Secondary | ICD-10-CM

## 2021-04-17 MED ORDER — MEDROXYPROGESTERONE ACETATE 150 MG/ML IM SUSP
150.0000 mg | Freq: Once | INTRAMUSCULAR | Status: AC
  Administered 2021-04-17: 150 mg via INTRAMUSCULAR

## 2021-05-08 ENCOUNTER — Encounter: Payer: Self-pay | Admitting: Family Medicine

## 2021-05-08 ENCOUNTER — Ambulatory Visit (INDEPENDENT_AMBULATORY_CARE_PROVIDER_SITE_OTHER): Payer: 59 | Admitting: Family Medicine

## 2021-05-08 VITALS — BP 118/80 | HR 61 | Temp 98.1°F | Ht 63.0 in | Wt 215.4 lb

## 2021-05-08 DIAGNOSIS — Z13220 Encounter for screening for lipoid disorders: Secondary | ICD-10-CM | POA: Diagnosis not present

## 2021-05-08 DIAGNOSIS — Z0000 Encounter for general adult medical examination without abnormal findings: Secondary | ICD-10-CM | POA: Insufficient documentation

## 2021-05-08 DIAGNOSIS — E669 Obesity, unspecified: Secondary | ICD-10-CM

## 2021-05-08 DIAGNOSIS — Z130 Encounter for screening for diseases of the blood and blood-forming organs and certain disorders involving the immune mechanism: Secondary | ICD-10-CM | POA: Diagnosis not present

## 2021-05-08 DIAGNOSIS — R739 Hyperglycemia, unspecified: Secondary | ICD-10-CM | POA: Diagnosis not present

## 2021-05-08 NOTE — Progress Notes (Signed)
? ?Subjective:  ?Patient ID: Mary [name], female    DOB: 03-02-1985  Age: 36 y.o. MRN: [id number] ? ?CC: ?Chief Complaint  ?Patient presents with  ? Annual Exam  ?  Mary [name] usually does Pap  ? ? ?HPI: ? ?36 year old female presents for an annual exam. ? ?Patient states that she is doing well.  She has no complaints or concerns at this time. ? ?Patient's preventative healthcare items are up-to-date excluding booster vaccinations for COVID-19.  Patient is due for Pap smear later this year.  Last Pap smear was in June 2020 and was normal.  Tetanus up-to-date.  Has had prior HIV and hepatitis C screening. ? ?Patient Active Problem List  ? Diagnosis Date Noted  ? Annual physical exam 05/08/2021  ? 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 ? ?Social Hx   ?Social History  ? ?Socioeconomic History  ? Marital status: Married  ?  Spouse name: Chaya Dehaan  ? Number of children: 1  ? Years of education: Not on file  ? Highest education level: Not on file  ?Occupational History  ?  Comment: [employer]   ?Tobacco Use  ? Smoking status: Never  ? Smokeless tobacco: Never  ?Vaping Use  ? Vaping Use: Never used  ?Substance and Sexual Activity  ? Alcohol use: No  ? Drug use: No  ? Sexual activity: Not Currently  ?  Birth control/protection: None  ?Other Topics Concern  ? Not on file  ?Social History Narrative  ? Not on file  ? ?Social Determinants of Health  ? ?Financial Resource Strain: Not on file  ?Food Insecurity: Not on file  ?Transportation Needs: Not on file  ?Physical Activity: Not on file  ?Stress: Not on file  ?Social Connections: Not on file  ? ? ?Review of Systems  ?Constitutional: Negative.   ?Respiratory: Negative.    ?Cardiovascular: Negative.   ?Gastrointestinal: Negative.   ? ?Objective:  ?BP 118/80   Pulse 61   Temp 98.1 ?F (36.7 ?C)   Ht '5\' 3"'  (1.6 m)   Wt 215 lb 6.4 oz (97.7 kg)   SpO2 95%   BMI 38.16 kg/m?  ? ? ?  05/08/2021  ?  8:58 AM 09/21/2020  ? 10:19 AM 09/21/2020  ? 10:15 AM  ?BP/Weight  ?Systolic BP  [phone]  ?Diastolic BP 80 73 78  ?Wt. (Lbs) 215.4    ?BMI 38.16 kg/m2    ? ? ?Physical Exam ?Constitutional:   ?   General: She is not in acute distress. ?   Appearance: Normal appearance. She is obese. She is not ill-appearing.  ?HENT:  ?   Head: Normocephalic and atraumatic.  ?   Right Ear: Tympanic membrane normal.  ?   Left Ear: Tympanic membrane normal.  ?   Mouth/Throat:  ?   Pharynx: Oropharynx is clear.  ?Eyes:  ?   General:     ?   Right eye: No discharge.     ?   Left eye: No discharge.  ?   Conjunctiva/sclera: Conjunctivae normal.  ?Cardiovascular:  ?   Rate and Rhythm: Normal rate.  ?   Heart sounds: No murmur heard. ?Pulmonary:  ?   Effort: Pulmonary effort is normal.  ?   Breath sounds: Normal breath sounds. No wheezing, rhonchi or rales.  ?Abdominal:  ?   General: There is no distension.  ?   Palpations: Abdomen is soft.  ?   Tenderness: There is no abdominal tenderness.  ?Neurological:  ?  Mental Status: She is alert.  ?Psychiatric:     ?   Mood and Affect: Mood normal.     ?   Behavior: Behavior normal.  ? ? ?Lab Results  ?Component Value Date  ? WBC 10.0 09/16/2020  ? HGB 13.1 09/16/2020  ? HCT 38.8 09/16/2020  ? PLT 273 09/16/2020  ? GLUCOSE 117 (H) 09/16/2020  ? CHOL 150 05/10/2020  ? TRIG 73 05/10/2020  ? HDL 48 05/10/2020  ? [place] 88 05/10/2020  ? ALT 27 09/16/2020  ? AST 20 09/16/2020  ? NA 138 09/16/2020  ? K 3.6 09/16/2020  ? CL 108 09/16/2020  ? CREATININE 0.85 09/16/2020  ? BUN 16 09/16/2020  ? CO2 24 09/16/2020  ? TSH 2.720 05/10/2020  ? HGBA1C 4.9 08/03/2018  ? ? ? ?Assessment & Plan:  ? ?Problem List Items Addressed This Visit   ? ?  ? Other  ? Annual physical exam - Primary  ?  Preventative healthcare measures are up-to-date excluding booster vaccines for COVID-19. ?Patient is due for Pap smear later this year.  She desires Mary [name] to perform Pap smear. ?Screening labs ordered. ?  ?  ? ?Other Visit Diagnoses   ? ? Screening for deficiency anemia      ? Relevant Orders  ? CBC  ?  Obesity (BMI 30-39.9)      ? Relevant Orders  ? CMP14+EGFR  ? Blood glucose elevated      ? Relevant Orders  ? Hemoglobin A1c  ? Screening for lipid disorders      ? Relevant Orders  ? Lipid panel  ? ?  ? ? ?Follow-up:  Return in about 3 months (around 08/08/2021) for [facility] - Pap. ? ?Thersa Salt DO ?[place] ? ? ?

## 2021-05-08 NOTE — Assessment & Plan Note (Signed)
Preventative healthcare measures are up-to-date excluding booster vaccines for COVID-19. ?Patient is due for Pap smear later this year.  She desires [name] to perform Pap smear. ?Screening labs ordered. ?

## 2021-05-08 NOTE — Patient Instructions (Signed)
Labs ordered. ? ?Follow up at the end of June or after for pap smear Hoyle Sauer). ? ?Healthy diet and regular exercise. ? ?Take care ? ?Dr. Lacinda Axon ?

## 2021-05-09 LAB — LIPID PANEL
Chol/HDL Ratio: 3 ratio (ref 0.0–4.4)
Cholesterol, Total: 137 mg/dL (ref 100–199)
HDL: 46 mg/dL (ref >39)
LDL Chol Calc (NIH): 81 mg/dL (ref 0–99)
Triglycerides: 42 mg/dL (ref 0–149)
VLDL Cholesterol Cal: 10 mg/dL (ref 5–40)

## 2021-05-09 LAB — CMP14+EGFR
ALT: 18 IU/L (ref 0–32)
AST: 16 IU/L (ref 0–40)
Albumin/Globulin Ratio: 1.5 (ref 1.2–2.2)
Albumin: 4.4 g/dL (ref 3.8–4.8)
Alkaline Phosphatase: 85 IU/L (ref 44–121)
BUN/Creatinine Ratio: 11 (ref 9–23)
BUN: 10 mg/dL (ref 6–20)
Bilirubin Total: 0.4 mg/dL (ref 0.0–1.2)
CO2: 22 mmol/L (ref 20–29)
Calcium: 9.9 mg/dL (ref 8.7–10.2)
Chloride: 107 mmol/L — ABNORMAL HIGH (ref 96–106)
Creatinine, Ser: 0.87 mg/dL (ref 0.57–1.00)
Globulin, Total: 3 g/dL (ref 1.5–4.5)
Glucose: 100 mg/dL — ABNORMAL HIGH (ref 70–99)
Potassium: 4.6 mmol/L (ref 3.5–5.2)
Sodium: 140 mmol/L (ref 134–144)
Total Protein: 7.4 g/dL (ref 6.0–8.5)
eGFR: 90 mL/min/{1.73_m2} (ref >59)

## 2021-05-09 LAB — CBC
Hematocrit: 39 % (ref 34.0–46.6)
Hemoglobin: 12.9 g/dL (ref 11.1–15.9)
MCH: 29.2 pg (ref 26.6–33.0)
MCHC: 33.1 g/dL (ref 31.5–35.7)
MCV: 88 fL (ref 79–97)
Platelets: 281 10*3/uL (ref 150–450)
RBC: 4.42 x10E6/uL (ref 3.77–5.28)
RDW: 12.6 % (ref 11.7–15.4)
WBC: 5.9 10*3/uL (ref 3.4–10.8)

## 2021-05-09 LAB — HEMOGLOBIN A1C
Est. average glucose Bld gHb Est-mCnc: 103 mg/dL
Hgb A1c MFr Bld: 5.2 % (ref 4.8–5.6)

## 2021-05-30 DIAGNOSIS — Z20822 Contact with and (suspected) exposure to covid-19: Secondary | ICD-10-CM | POA: Diagnosis not present

## 2021-07-10 ENCOUNTER — Other Ambulatory Visit (INDEPENDENT_AMBULATORY_CARE_PROVIDER_SITE_OTHER): Payer: 59 | Admitting: *Deleted

## 2021-07-10 DIAGNOSIS — Z3042 Encounter for surveillance of injectable contraceptive: Secondary | ICD-10-CM | POA: Diagnosis not present

## 2021-07-10 MED ORDER — MEDROXYPROGESTERONE ACETATE 150 MG/ML IM SUSP
150.0000 mg | Freq: Once | INTRAMUSCULAR | Status: AC
Start: 2021-07-10 — End: 2021-07-10
  Administered 2021-07-10: 150 mg via INTRAMUSCULAR

## 2021-08-09 ENCOUNTER — Ambulatory Visit: Payer: 59 | Admitting: Nurse Practitioner

## 2021-09-20 ENCOUNTER — Encounter: Payer: Self-pay | Admitting: Nurse Practitioner

## 2021-09-20 ENCOUNTER — Ambulatory Visit (INDEPENDENT_AMBULATORY_CARE_PROVIDER_SITE_OTHER): Payer: 59 | Admitting: Nurse Practitioner

## 2021-09-20 VITALS — BP 114/75 | HR 63 | Temp 97.2°F | Ht 63.0 in | Wt 203.0 lb

## 2021-09-20 DIAGNOSIS — Z01419 Encounter for gynecological examination (general) (routine) without abnormal findings: Secondary | ICD-10-CM | POA: Diagnosis not present

## 2021-09-20 DIAGNOSIS — Z124 Encounter for screening for malignant neoplasm of cervix: Secondary | ICD-10-CM | POA: Diagnosis not present

## 2021-09-20 DIAGNOSIS — Z1151 Encounter for screening for human papillomavirus (HPV): Secondary | ICD-10-CM | POA: Diagnosis not present

## 2021-09-20 DIAGNOSIS — Z3042 Encounter for surveillance of injectable contraceptive: Secondary | ICD-10-CM

## 2021-09-20 MED ORDER — MEDROXYPROGESTERONE ACETATE 150 MG/ML IM SUSP
150.0000 mg | Freq: Once | INTRAMUSCULAR | Status: AC
  Administered 2021-09-20: 150 mg via INTRAMUSCULAR

## 2021-09-20 NOTE — Addendum Note (Signed)
Addended by: Alm Bustard R on: 09/20/2021 11:28 AM   Modules accepted: Orders

## 2021-09-20 NOTE — Progress Notes (Signed)
   Subjective:    Patient ID: Mary [name], female    DOB: [birth year], 36 y.o.   MRN: [id number]  Gynecologic Exam The patient's pertinent negatives include no pelvic pain or vaginal discharge. Pertinent negatives include no dysuria, frequency or urgency.  Presents for her gynecological exam and Pap smear.  Wellness exam was done with Dr. Adriana Simas on 05/08/2021.  Same sexual female partner.  Defers STD testing.  Currently on Depo-Provera, requesting her regular injection today.  Denies any vaginal bleeding discharge or pelvic pain.  Has occasional mild urinary leakage, not a major issue at this time.  Has been looking at more permanent birth control.    Review of Systems  Genitourinary:  Positive for enuresis. Negative for dysuria, frequency, genital sores, menstrual problem, pelvic pain, urgency, vaginal bleeding and vaginal discharge.       Denies rash itching or burning in the genital area.       Objective:   Physical Exam NAD.  Alert, oriented.  External GU no rashes or lesions.  Vagina pink and moist, no discharge noted.  Cervix normal in appearance.  No CMT.  Bimanual exam no tenderness or obvious masses.  Today's Vitals   09/20/21 0824  BP: 114/75  Pulse: 63  Temp: (!) 97.2 F (36.2 C)  SpO2: 100%  Weight: 203 lb (92.1 kg)   Body mass index is 35.96 kg/m.        Assessment & Plan:  Encounter for cervical Pap smear with pelvic exam - Plan: IGP, Aptima HPV  Screening for cervical cancer - Plan: IGP, Aptima HPV  Screening for HPV (human papillomavirus) - Plan: IGP, Aptima HPV  Encounter for management and injection of depo-Provera  Depo-Provera injection today.  Discussed risk associated with prolonged Depo-Provera use.  Patient is considering more permanent contraception. Call back if any worsening urinary issues. Return in about 1 year (around 09/21/2022) for physical.

## 2021-09-25 ENCOUNTER — Other Ambulatory Visit: Payer: Self-pay

## 2021-09-25 LAB — IGP, APTIMA HPV: HPV Aptima: NEGATIVE

## 2021-11-10 IMAGING — CT CT ABD-PELV W/ CM
2 of 4 series · 16 of 46 positions shown, 18 images · IV contrast (Omnipaque or Isovue)
Comparison: None.

CLINICAL DATA: Right upper quadrant and back pain

EXAM:
CT ABDOMEN AND PELVIS WITH CONTRAST
TECHNIQUE: Multidetector CT imaging of the abdomen and pelvis was performed
using the standard protocol following bolus administration of
intravenous contrast.
CONTRAST:  80mL OMNIPAQUE IOHEXOL 350 MG/ML SOLN

[Series 2: axial st · axial · 0.72mm/px · z∈[+618,+1033]mm · 13 of 91 slices shown, 15 images]
[im 4/91  soft-tissue]
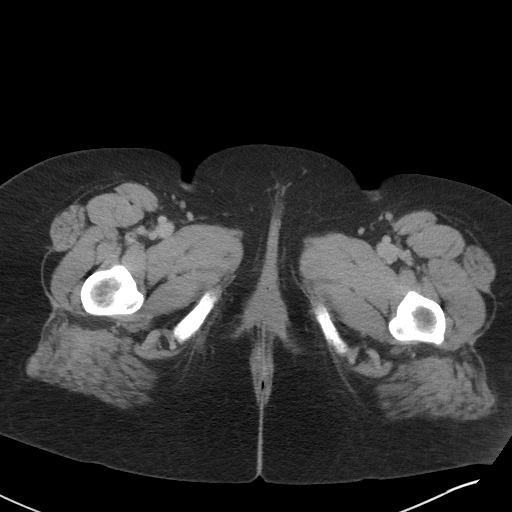
[im 4/91  bone]
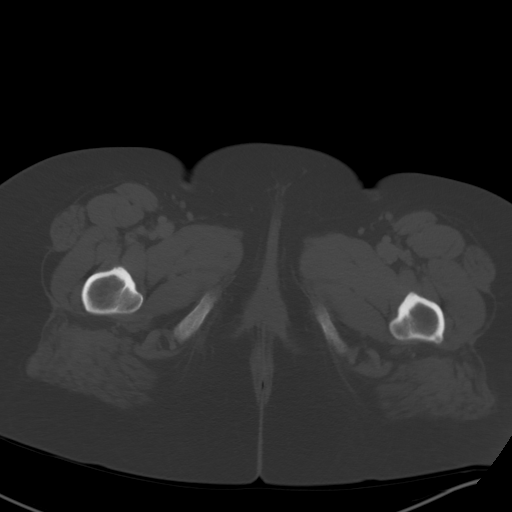
[im 11/91  soft-tissue]
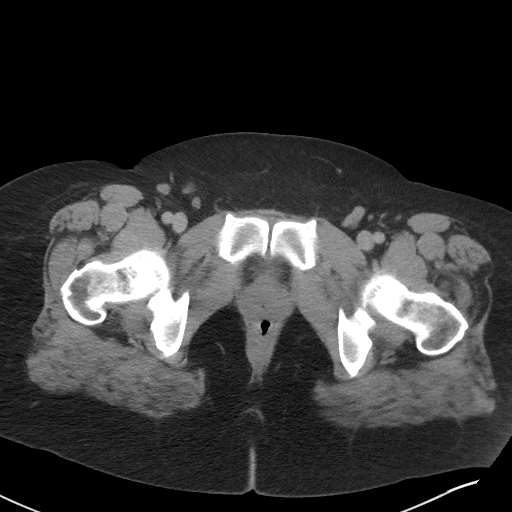
[im 19/91  soft-tissue]
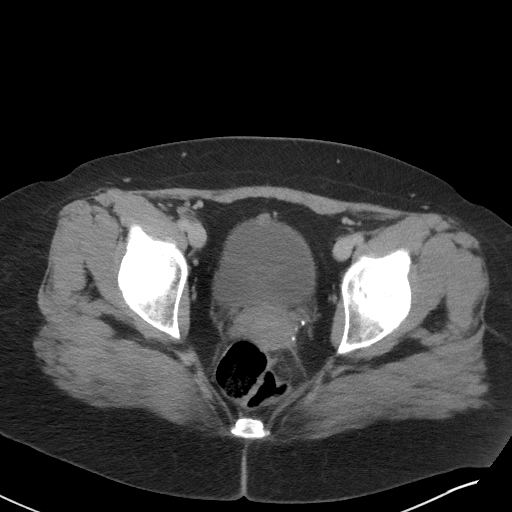
[im 26/91  soft-tissue]
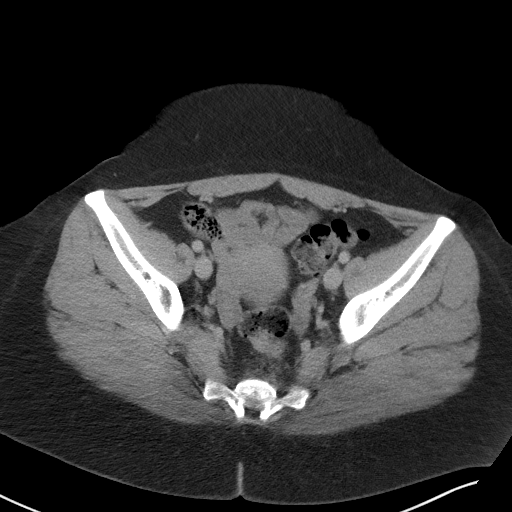
[im 33/91  soft-tissue]
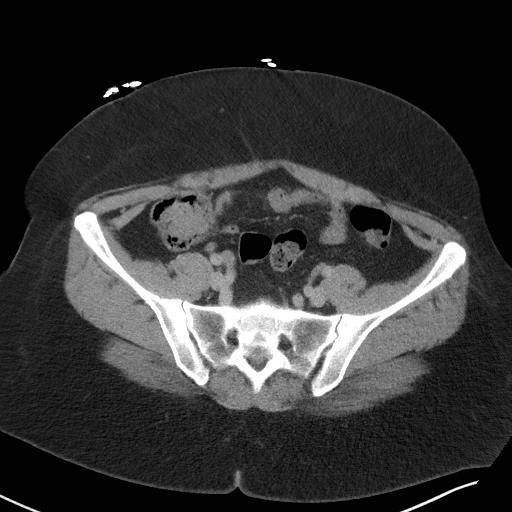
[im 40/91  soft-tissue]
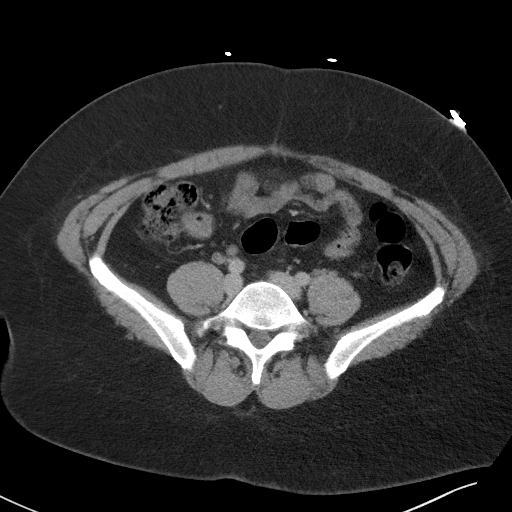
[im 47/91  soft-tissue]
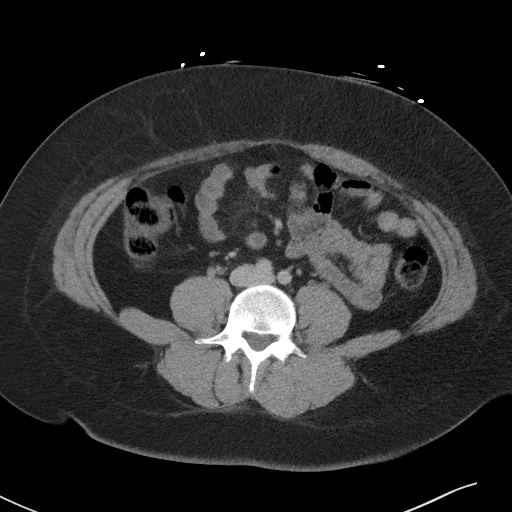
[im 51/91  soft-tissue]
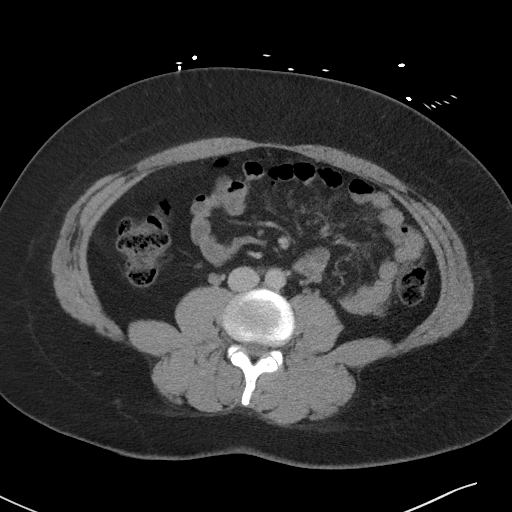
[im 58/91  soft-tissue]
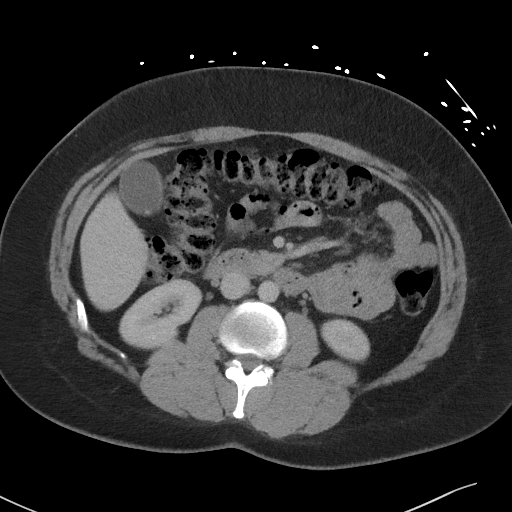
[im 58/91  bone]
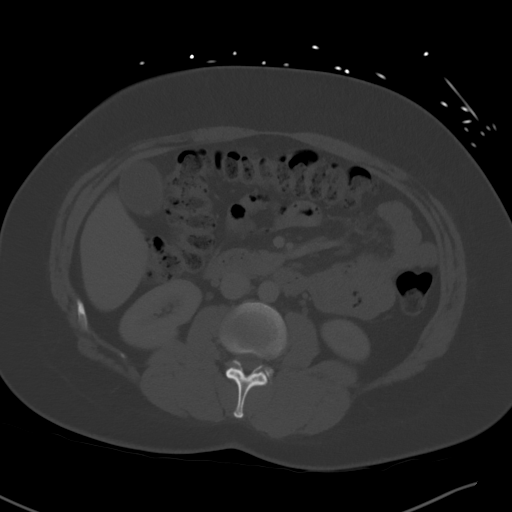
[im 65/91  soft-tissue]
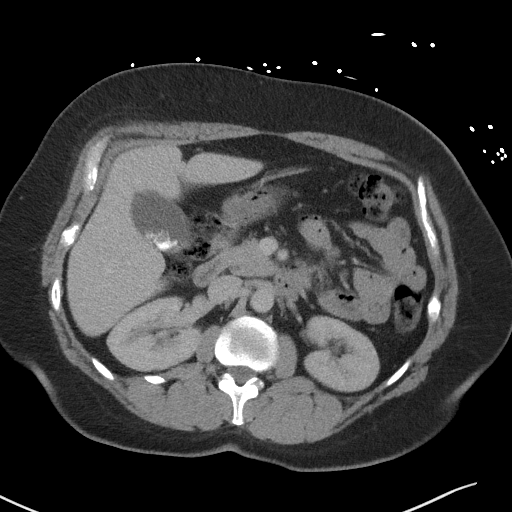
[im 73/91  soft-tissue]
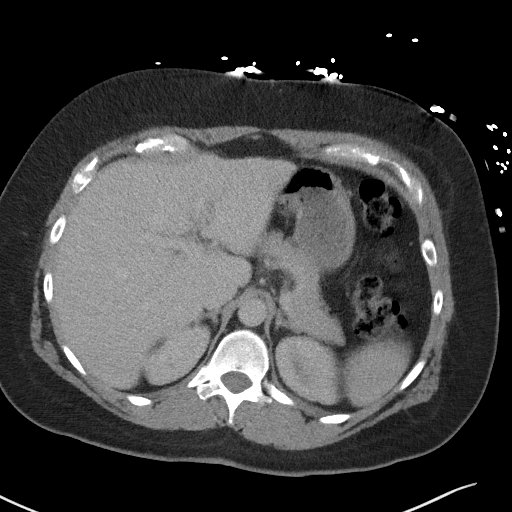
[im 80/91  soft-tissue]
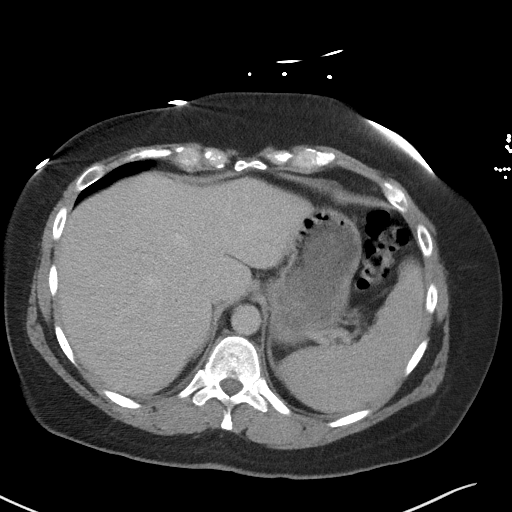
[im 87/91  soft-tissue]
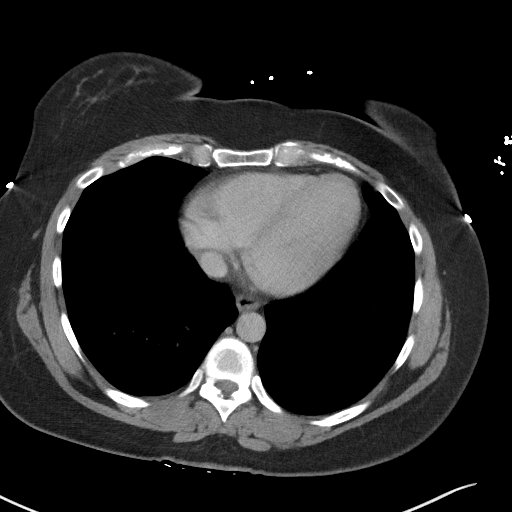

[Series 5: coronal st · coronal · 0.66mm/px · 3 of 103 slices shown]
[im 35/103  soft-tissue]
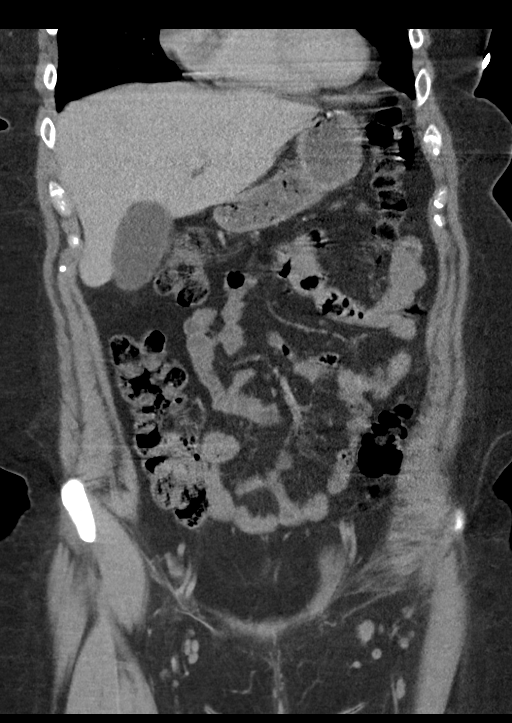
[im 46/103  soft-tissue]
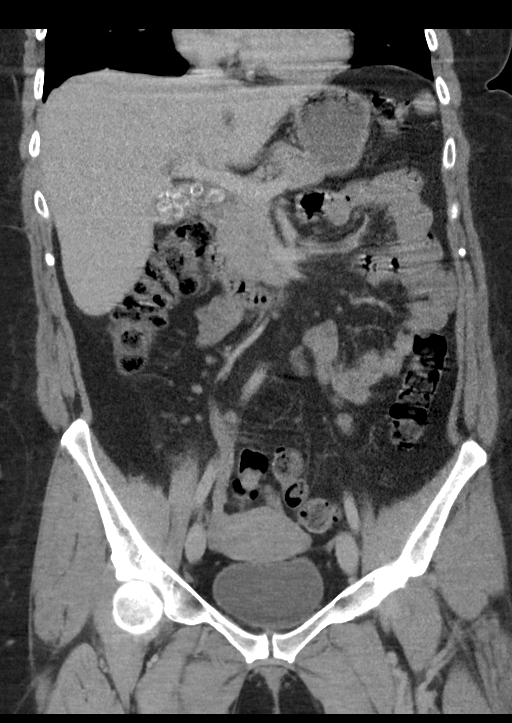
[im 57/103  soft-tissue]
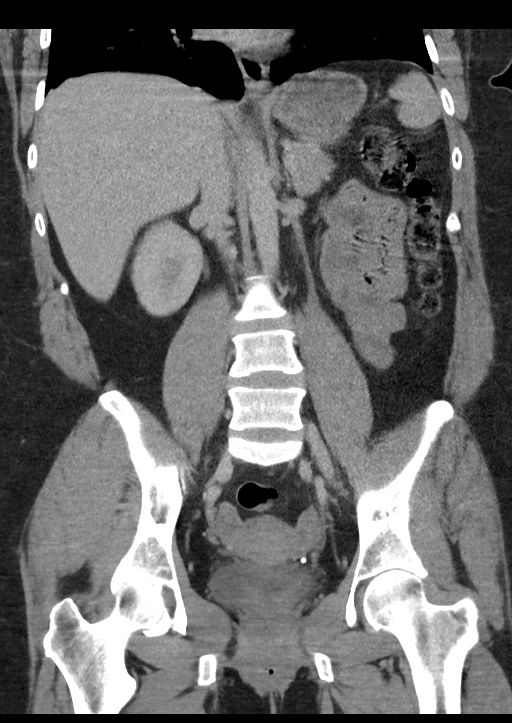

[16 of 46 positions shown; findings below may reference images not displayed]

FINDINGS: Lower chest:  No contributory findings.

Hepatobiliary: No focal liver abnormality.Multiple lamellated
gallstones. No pericholecystic edema noted. No bile duct dilatation.

Pancreas: Unremarkable.

Spleen: Unremarkable.

Adrenals/Urinary Tract: Negative adrenals. No hydronephrosis or
stone. Unremarkable bladder.

Stomach/Bowel:  No obstruction. No appendicitis.

Vascular/Lymphatic: No acute vascular abnormality. No mass or
adenopathy.

Reproductive:No pathologic findings.

Other: No ascites or pneumoperitoneum.

Musculoskeletal: No acute abnormalities.
IMPRESSION: 1. No acute finding.
2. Multiple gallstones.

## 2021-11-11 IMAGING — US US ABDOMEN LIMITED
1 series · 14 of 25 positions shown · non-contrast
Comparison: CT 09/16/2020

CLINICAL DATA: Cholelithiasis.  Right upper quadrant pain

EXAM:
ULTRASOUND ABDOMEN LIMITED RIGHT UPPER QUADRANT

[Series 1: us abdomen limited ruq (liver/gb) · 80 acquisitions, 14 frames shown]
[im 1/80]
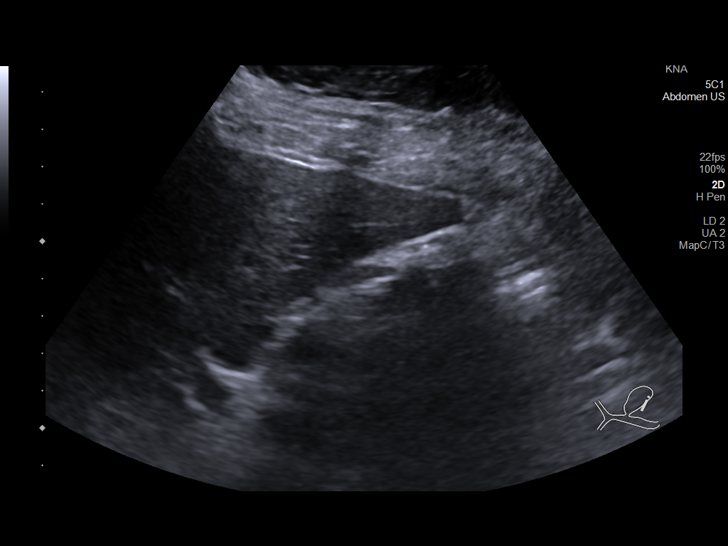
[im 7/80]
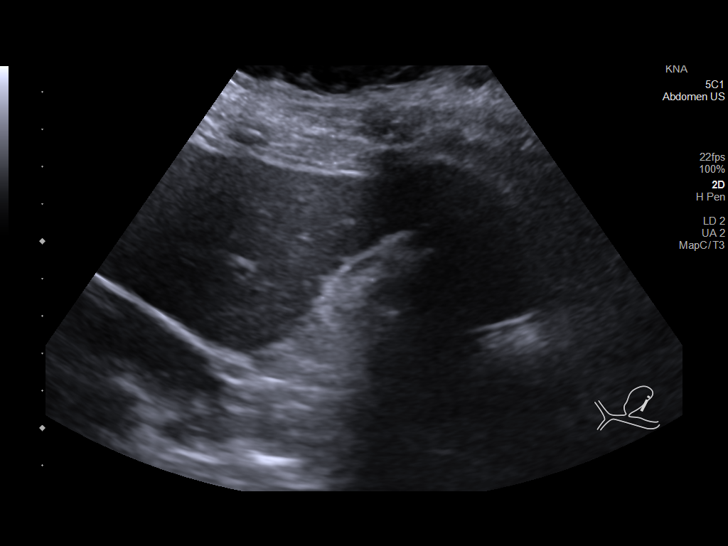
[im 14/80]
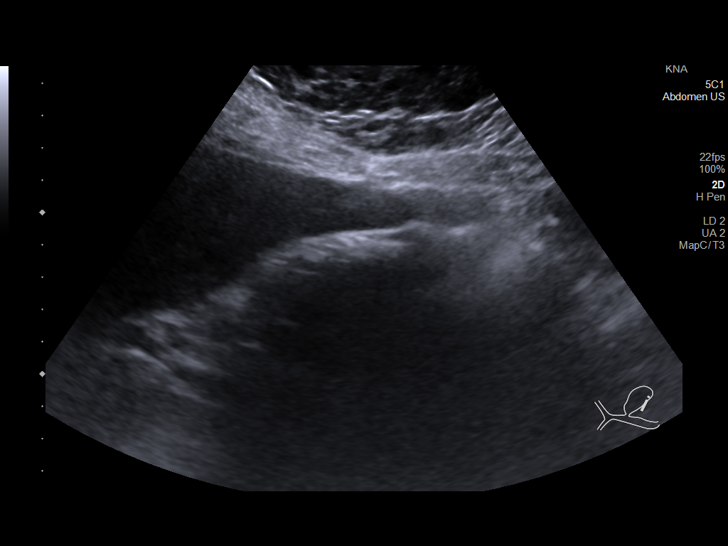
[im 20/80]
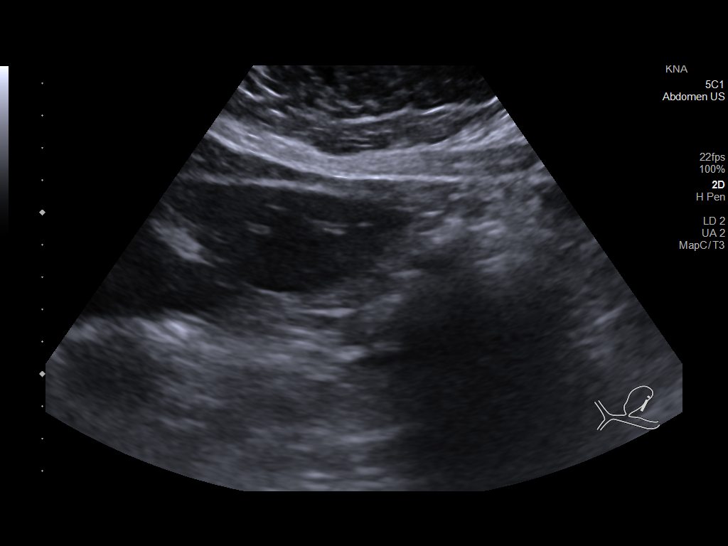
[im 27/80]
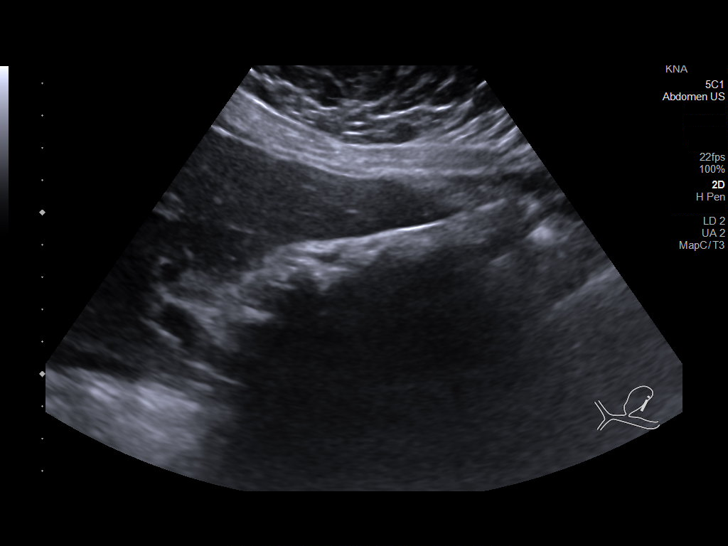
[im 30/80]
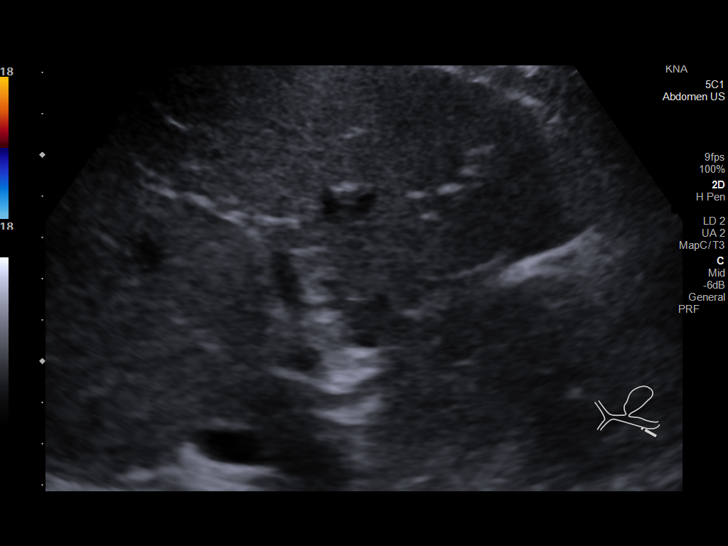
[im 37/80]
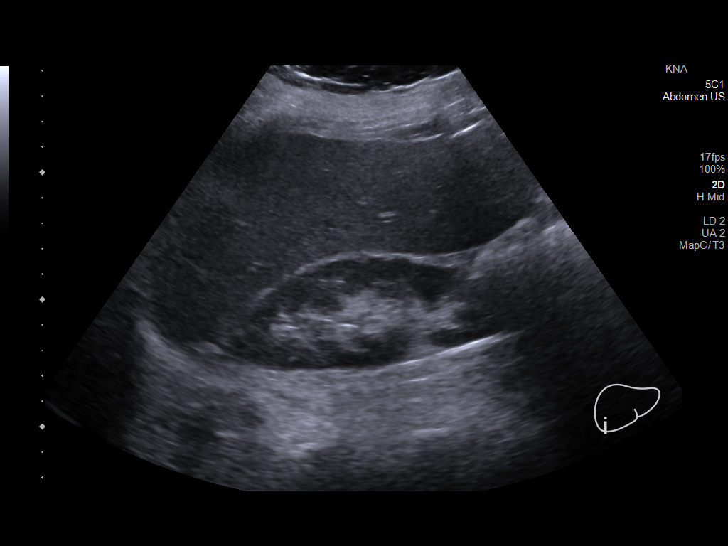
[im 43/80]
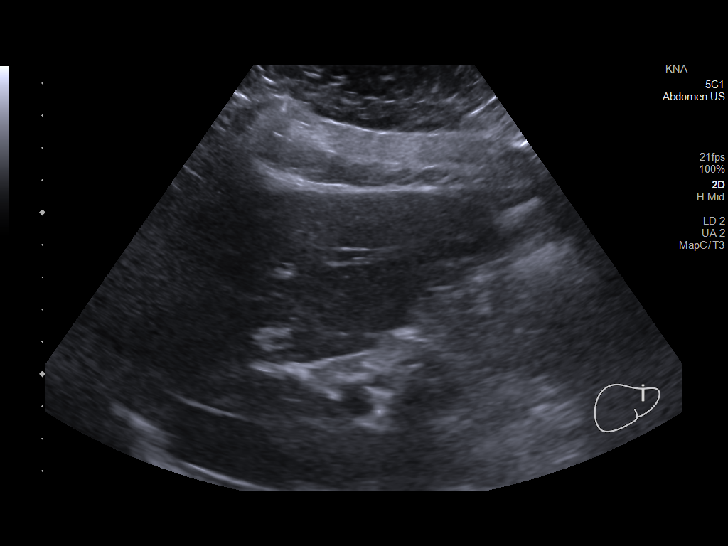
[im 50/80]
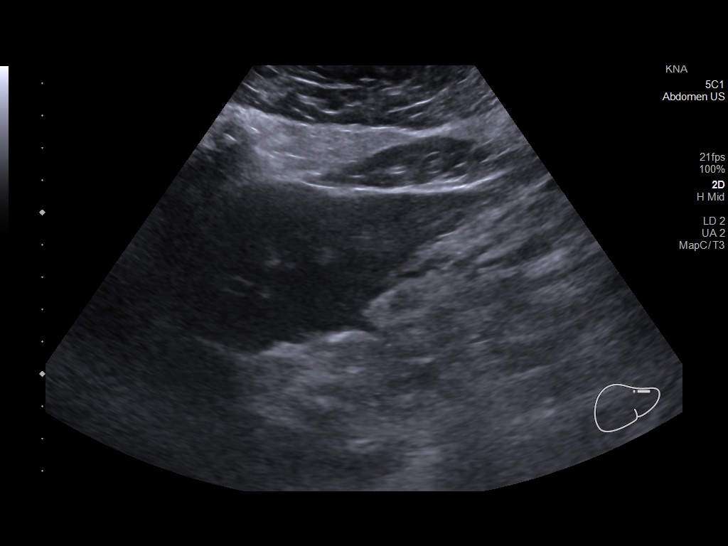
[im 53/80]
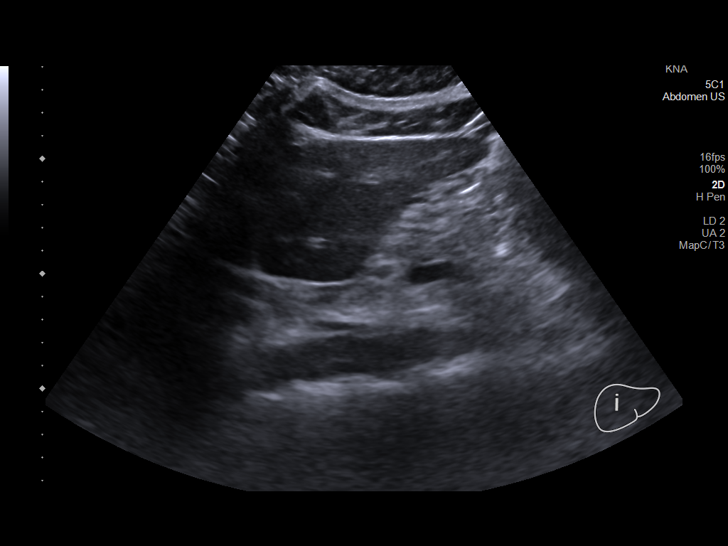
[im 60/80]
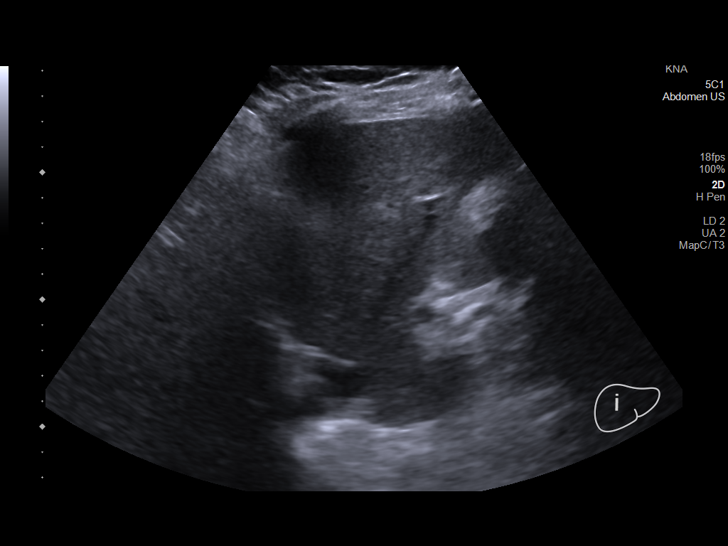
[im 66/80]
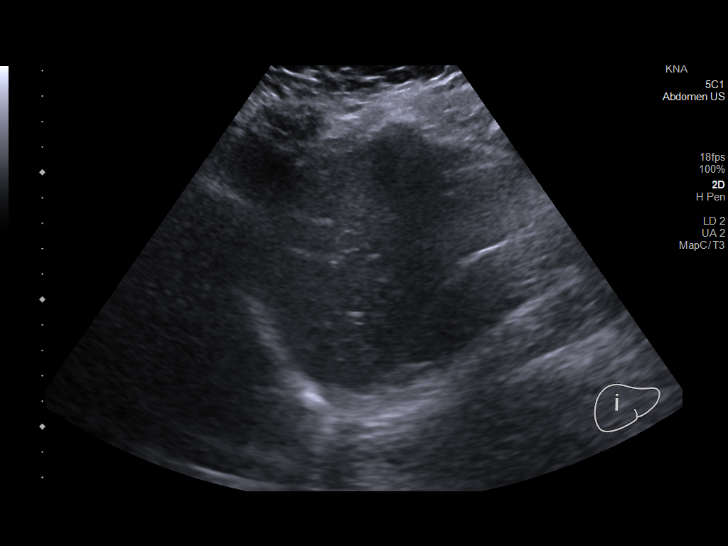
[im 73/80]
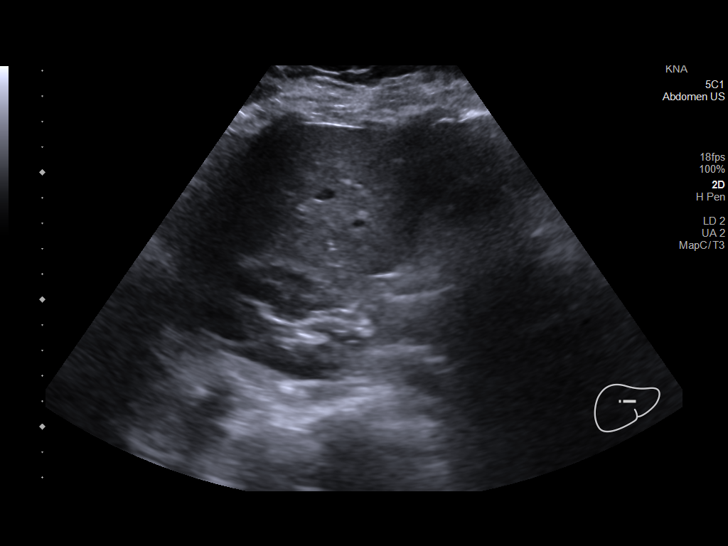
[im 80/80]
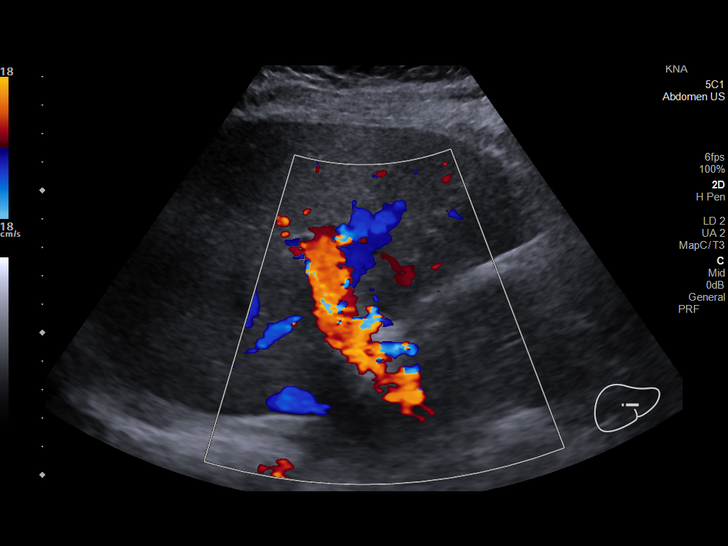

[14 of 25 positions shown; findings below may reference images not displayed]

FINDINGS: Gallbladder:

Wall-echo-shadow appearance of the gallbladder compatible with
multiple gallstones. Largest visualized own measures approximately
1.5 cm in diameter. No wall thickening is visualized. No sonographic
Murphy sign noted by sonographer.

Common bile duct:

Diameter: 6 mm.

Liver:

No focal lesion identified. Within normal limits in parenchymal
echogenicity. Portal vein is patent on color Doppler imaging with
normal direction of blood flow towards the liver.

Other: None.
IMPRESSION: Cholelithiasis without sonographic evidence of acute cholecystitis.
A nuclear medicine hepatobiliary scan could be performed to assess
patency of the cystic duct and common bile duct, as clinically
indicated.

## 2021-12-11 ENCOUNTER — Ambulatory Visit (INDEPENDENT_AMBULATORY_CARE_PROVIDER_SITE_OTHER): Payer: 59

## 2021-12-11 DIAGNOSIS — Z3042 Encounter for surveillance of injectable contraceptive: Secondary | ICD-10-CM | POA: Diagnosis not present

## 2021-12-11 MED ORDER — MEDROXYPROGESTERONE ACETATE 150 MG/ML IM SUSP
150.0000 mg | Freq: Once | INTRAMUSCULAR | Status: AC
  Administered 2021-12-11: 150 mg via INTRAMUSCULAR

## 2022-01-22 DIAGNOSIS — M79672 Pain in left foot: Secondary | ICD-10-CM | POA: Diagnosis not present

## 2022-01-24 DIAGNOSIS — M722 Plantar fascial fibromatosis: Secondary | ICD-10-CM | POA: Diagnosis not present

## 2022-01-24 DIAGNOSIS — R29898 Other symptoms and signs involving the musculoskeletal system: Secondary | ICD-10-CM | POA: Diagnosis not present

## 2022-01-24 DIAGNOSIS — R2242 Localized swelling, mass and lump, left lower limb: Secondary | ICD-10-CM | POA: Diagnosis not present

## 2022-01-24 DIAGNOSIS — M79672 Pain in left foot: Secondary | ICD-10-CM | POA: Diagnosis not present

## 2022-01-29 ENCOUNTER — Ambulatory Visit: Payer: 59 | Admitting: Family

## 2022-02-02 DIAGNOSIS — M25572 Pain in left ankle and joints of left foot: Secondary | ICD-10-CM | POA: Diagnosis not present

## 2022-02-02 DIAGNOSIS — M79672 Pain in left foot: Secondary | ICD-10-CM | POA: Diagnosis not present

## 2022-02-02 DIAGNOSIS — M25511 Pain in right shoulder: Secondary | ICD-10-CM | POA: Diagnosis not present

## 2022-02-06 DIAGNOSIS — R29898 Other symptoms and signs involving the musculoskeletal system: Secondary | ICD-10-CM | POA: Diagnosis not present

## 2022-02-06 DIAGNOSIS — R2242 Localized swelling, mass and lump, left lower limb: Secondary | ICD-10-CM | POA: Diagnosis not present

## 2022-02-06 DIAGNOSIS — M722 Plantar fascial fibromatosis: Secondary | ICD-10-CM | POA: Diagnosis not present

## 2022-02-26 ENCOUNTER — Ambulatory Visit: Payer: Commercial Managed Care - PPO

## 2022-02-26 ENCOUNTER — Ambulatory Visit: Payer: 59

## 2022-02-26 DIAGNOSIS — Z3042 Encounter for surveillance of injectable contraceptive: Secondary | ICD-10-CM

## 2022-02-26 MED ORDER — MEDROXYPROGESTERONE ACETATE 150 MG/ML IM SUSP
150.0000 mg | Freq: Once | INTRAMUSCULAR | Status: AC
  Administered 2022-02-26: 150 mg via INTRAMUSCULAR

## 2022-05-21 ENCOUNTER — Ambulatory Visit (INDEPENDENT_AMBULATORY_CARE_PROVIDER_SITE_OTHER): Payer: Commercial Managed Care - PPO

## 2022-05-21 DIAGNOSIS — Z3042 Encounter for surveillance of injectable contraceptive: Secondary | ICD-10-CM | POA: Diagnosis not present

## 2022-05-21 MED ORDER — MEDROXYPROGESTERONE ACETATE 150 MG/ML IM SUSY
150.0000 mg | PREFILLED_SYRINGE | Freq: Once | INTRAMUSCULAR | Status: AC
  Administered 2022-05-21: 150 mg via INTRAMUSCULAR

## 2022-06-28 ENCOUNTER — Telehealth: Payer: Commercial Managed Care - PPO | Admitting: Nurse Practitioner

## 2022-06-28 DIAGNOSIS — K047 Periapical abscess without sinus: Secondary | ICD-10-CM | POA: Diagnosis not present

## 2022-06-28 MED ORDER — AMOXICILLIN-POT CLAVULANATE 875-125 MG PO TABS: 1.0000 | ORAL_TABLET | Freq: Two times a day (BID) | ORAL | 0 refills | Status: DC

## 2022-06-28 NOTE — Progress Notes (Signed)

## 2022-08-06 ENCOUNTER — Ambulatory Visit: Payer: Commercial Managed Care - PPO

## 2022-09-26 ENCOUNTER — Other Ambulatory Visit: Payer: Self-pay | Admitting: Nurse Practitioner

## 2022-09-26 ENCOUNTER — Other Ambulatory Visit (HOSPITAL_COMMUNITY): Payer: Self-pay

## 2022-09-26 ENCOUNTER — Ambulatory Visit (INDEPENDENT_AMBULATORY_CARE_PROVIDER_SITE_OTHER): Payer: Commercial Managed Care - PPO | Admitting: Nurse Practitioner

## 2022-09-26 ENCOUNTER — Encounter: Payer: Self-pay | Admitting: Nurse Practitioner

## 2022-09-26 VITALS — BP 124/80 | HR 61 | Temp 97.9°F | Ht 63.0 in | Wt 203.0 lb

## 2022-09-26 DIAGNOSIS — Z01411 Encounter for gynecological examination (general) (routine) with abnormal findings: Secondary | ICD-10-CM | POA: Diagnosis not present

## 2022-09-26 DIAGNOSIS — Z01419 Encounter for gynecological examination (general) (routine) without abnormal findings: Secondary | ICD-10-CM | POA: Diagnosis not present

## 2022-09-26 DIAGNOSIS — Z13228 Encounter for screening for other metabolic disorders: Secondary | ICD-10-CM

## 2022-09-26 DIAGNOSIS — Z130 Encounter for screening for diseases of the blood and blood-forming organs and certain disorders involving the immune mechanism: Secondary | ICD-10-CM

## 2022-09-26 DIAGNOSIS — Z13220 Encounter for screening for lipoid disorders: Secondary | ICD-10-CM

## 2022-09-26 DIAGNOSIS — Z1329 Encounter for screening for other suspected endocrine disorder: Secondary | ICD-10-CM

## 2022-09-26 DIAGNOSIS — Z113 Encounter for screening for infections with a predominantly sexual mode of transmission: Secondary | ICD-10-CM

## 2022-09-26 DIAGNOSIS — Z302 Encounter for sterilization: Secondary | ICD-10-CM

## 2022-09-26 MED ORDER — NORETHINDRONE ACET-ETHINYL EST 1-20 MG-MCG PO TABS
1.0000 | ORAL_TABLET | Freq: Every day | ORAL | 3 refills | Status: DC
  Filled 2022-09-26: qty 63, 84d supply, fill #0
  Filled 2022-09-29: qty 84, 84d supply, fill #0

## 2022-09-26 NOTE — Progress Notes (Signed)
Subjective:    Patient ID: Mary [name], female    DOB: October 25, 1985, 37 y.o.   MRN: [id number]  HPI The patient comes in today for a wellness visit.    A review of their health history was completed.  A review of medications was also completed.  Any needed refills; noe  Eating habits: good; separated from her husband; more stress eating  Falls/  MVA accidents in past few months: no  Regular exercise: yes; active lifestyle working as a Engineer, civil (consulting) in [employer] pt sees on regular basis:   Preventative health issues were discussed.   Additional concerns: cyst, wants to start bcp , stopped depo; no new sexual partners; no cycle since stopping Depo Provera in June. Wants to be referred for tubal ligation.  Regular vision and dental exams.    Review of Systems  Constitutional:  Negative for activity change, appetite change and fatigue.  HENT:  Negative for sore throat and trouble swallowing.   Respiratory:  Negative for cough, chest tightness, shortness of breath and wheezing.   Cardiovascular:  Negative for chest pain.  Gastrointestinal:  Negative for abdominal distention, abdominal pain, constipation, diarrhea, nausea and vomiting.  Genitourinary:  Negative for difficulty urinating, dysuria, enuresis, frequency, genital sores, menstrual problem, pelvic pain, urgency and vaginal discharge.  Skin:        Small cyst on left labia; has decreased in size; non tender      09/26/2022    8:33 AM  Depression screen PHQ 2/9  Decreased Interest 0  Down, Depressed, Hopeless 0  PHQ - 2 Score 0  Altered sleeping 1  Tired, decreased energy 0  Change in appetite 1  Feeling bad or failure about yourself  0  Trouble concentrating 0  Moving slowly or fidgety/restless 0  Suicidal thoughts 0  PHQ-9 Score 2  Difficult doing work/chores Not difficult at all      09/26/2022    8:33 AM  GAD 7 : Generalized Anxiety Score  Nervous, Anxious, on Edge 0  Control/stop worrying 0  Worry too  much - different things 0  Trouble relaxing 0  Restless 0  Easily annoyed or irritable 0  Afraid - awful might happen 0  Total GAD 7 Score 0  Anxiety Difficulty Not difficult at all         Objective:   Physical Exam Vitals and nursing note reviewed.  Constitutional:      General: She is not in acute distress.    Appearance: She is well-developed.     Comments: Defers chaparone.  Neck:     Thyroid: No thyromegaly.     Trachea: No tracheal deviation.     Comments: Thyroid non tender to palpation. No mass or goiter noted.  Cardiovascular:     Rate and Rhythm: Normal rate and regular rhythm.     Heart sounds: Normal heart sounds. No murmur heard. Pulmonary:     Effort: Pulmonary effort is normal.     Breath sounds: Normal breath sounds.  Chest:  Breasts:    Right: No swelling, inverted nipple, mass, skin change or tenderness.     Left: No swelling, inverted nipple, mass, skin change or tenderness.  Abdominal:     General: There is no distension.     Palpations: Abdomen is soft.     Tenderness: There is no abdominal tenderness.  Genitourinary:    Comments: External GU: tiny, non tender superficial dermoid cyst left outer mid labia. Otherwise skin clear. Vagina: small  amount clear to white mucoid discharge. Cervix normal in appearance. No CMT. Bimanual exam: no tenderness of obvious masses.  Musculoskeletal:     Cervical back: Normal range of motion and neck supple.  Lymphadenopathy:     Cervical: No cervical adenopathy.     Upper Body:     Right upper body: No supraclavicular, axillary or pectoral adenopathy.     Left upper body: No supraclavicular, axillary or pectoral adenopathy.  Skin:    General: Skin is warm and dry.     Findings: No rash.  Neurological:     Mental Status: She is alert and oriented to person, place, and time.  Psychiatric:        Mood and Affect: Mood normal.        Behavior: Behavior normal.        Thought Content: Thought content normal.         Judgment: Judgment normal.    Today's Vitals   09/26/22 0825  BP: 124/80  Pulse: 61  Temp: 97.9 F (36.6 C)  SpO2: 98%  Weight: 203 lb (92.1 kg)  Height: 5\' 3"  (1.6 m)   Body mass index is 35.96 kg/m.         Assessment & Plan:   Problem List Items Addressed This Visit   None Visit Diagnoses     Well woman exam    -  Primary   Relevant Orders   CBC with Differential/Platelet   Comprehensive metabolic panel   Lipid panel   TSH   GC/Chlamydia Probe Amp([facility])   Ambulatory referral to Obstetrics / Gynecology   Screen for STD (sexually transmitted disease)       Relevant Orders   HIV antibody (with reflex)   RPR   GC/Chlamydia Probe Amp([facility])   Screening for deficiency anemia       Relevant Orders   CBC with Differential/Platelet   Screening for metabolic disorder       Relevant Orders   Comprehensive metabolic panel   Screening for lipid disorders       Relevant Orders   Lipid panel   Screening for thyroid disorder       Relevant Orders   TSH      Discussed healthy habits including healthy diet. Non smoker. Restart oc's as directed. Reviewed potential risk with oc use. Discussed safe sex issues.  Meds ordered this encounter  Medications   norethindrone-ethinyl estradiol (LOESTRIN 1/20, 21,) 1-20 MG-MCG tablet    Sig: Take 1 tablet by mouth daily.    Dispense:  84 tablet    Refill:  3    Order Specific Question:   Supervising Provider    Answer:   Lilyan Punt A [[id number]]   Labs pending.  Recommend flu vaccine this fall.  Return in 1 year (on 09/26/2023) for physical.

## 2022-09-29 ENCOUNTER — Other Ambulatory Visit: Payer: Self-pay

## 2022-09-29 ENCOUNTER — Other Ambulatory Visit (HOSPITAL_COMMUNITY): Payer: Self-pay

## 2022-09-30 DIAGNOSIS — Z01419 Encounter for gynecological examination (general) (routine) without abnormal findings: Secondary | ICD-10-CM | POA: Diagnosis not present

## 2022-09-30 DIAGNOSIS — Z113 Encounter for screening for infections with a predominantly sexual mode of transmission: Secondary | ICD-10-CM | POA: Diagnosis not present

## 2022-09-30 DIAGNOSIS — Z13228 Encounter for screening for other metabolic disorders: Secondary | ICD-10-CM | POA: Diagnosis not present

## 2022-09-30 DIAGNOSIS — Z1329 Encounter for screening for other suspected endocrine disorder: Secondary | ICD-10-CM | POA: Diagnosis not present

## 2022-09-30 DIAGNOSIS — Z13220 Encounter for screening for lipoid disorders: Secondary | ICD-10-CM | POA: Diagnosis not present

## 2022-09-30 DIAGNOSIS — Z130 Encounter for screening for diseases of the blood and blood-forming organs and certain disorders involving the immune mechanism: Secondary | ICD-10-CM | POA: Diagnosis not present

## 2022-10-01 LAB — COMPREHENSIVE METABOLIC PANEL
ALT: 18 IU/L (ref 0–32)
AST: 17 IU/L (ref 0–40)
Albumin: 4.2 g/dL (ref 3.9–4.9)
Alkaline Phosphatase: 69 IU/L (ref 44–121)
BUN/Creatinine Ratio: 13 (ref 9–23)
BUN: 10 mg/dL (ref 6–20)
Bilirubin Total: 0.6 mg/dL (ref 0.0–1.2)
CO2: 20 mmol/L (ref 20–29)
Calcium: 9.1 mg/dL (ref 8.7–10.2)
Chloride: 108 mmol/L — ABNORMAL HIGH (ref 96–106)
Creatinine, Ser: 0.77 mg/dL (ref 0.57–1.00)
Globulin, Total: 2.4 g/dL (ref 1.5–4.5)
Glucose: 88 mg/dL (ref 70–99)
Potassium: 4.6 mmol/L (ref 3.5–5.2)
Sodium: 142 mmol/L (ref 134–144)
Total Protein: 6.6 g/dL (ref 6.0–8.5)
eGFR: 103 mL/min/{1.73_m2} (ref >59)

## 2022-10-01 LAB — CBC WITH DIFFERENTIAL/PLATELET
Basophils Absolute: 0 10*3/uL (ref 0.0–0.2)
Basos: 1 %
EOS (ABSOLUTE): 0 10*3/uL (ref 0.0–0.4)
Eos: 1 %
Hematocrit: 37.7 % (ref 34.0–46.6)
Hemoglobin: 12.5 g/dL (ref 11.1–15.9)
Immature Grans (Abs): 0 10*3/uL (ref 0.0–0.1)
Immature Granulocytes: 0 %
Lymphocytes Absolute: 2.3 10*3/uL (ref 0.7–3.1)
Lymphs: 37 %
MCH: 30.8 pg (ref 26.6–33.0)
MCHC: 33.2 g/dL (ref 31.5–35.7)
MCV: 93 fL (ref 79–97)
Monocytes Absolute: 0.3 10*3/uL (ref 0.1–0.9)
Monocytes: 5 %
Neutrophils Absolute: 3.5 10*3/uL (ref 1.4–7.0)
Neutrophils: 56 %
Platelets: 236 10*3/uL (ref 150–450)
RBC: 4.06 x10E6/uL (ref 3.77–5.28)
RDW: 12 % (ref 11.7–15.4)
WBC: 6.2 10*3/uL (ref 3.4–10.8)

## 2022-10-01 LAB — GC/CHLAMYDIA PROBE AMP
Chlamydia trachomatis, NAA: NEGATIVE
Neisseria Gonorrhoeae by PCR: NEGATIVE

## 2022-10-01 LAB — HIV ANTIBODY (ROUTINE TESTING W REFLEX): HIV Screen 4th Generation wRfx: NONREACTIVE

## 2022-10-01 LAB — LIPID PANEL
Chol/HDL Ratio: 2.5 ratio (ref 0.0–4.4)
Cholesterol, Total: 135 mg/dL (ref 100–199)
HDL: 54 mg/dL (ref >39)
LDL Chol Calc (NIH): 70 mg/dL (ref 0–99)
Triglycerides: 47 mg/dL (ref 0–149)
VLDL Cholesterol Cal: 11 mg/dL (ref 5–40)

## 2022-10-01 LAB — RPR: RPR Ser Ql: NONREACTIVE

## 2022-10-01 LAB — TSH: TSH: 1.47 u[IU]/mL (ref 0.450–4.500)

## 2022-10-07 ENCOUNTER — Other Ambulatory Visit (HOSPITAL_COMMUNITY): Payer: Self-pay

## 2022-10-07 ENCOUNTER — Encounter: Payer: Commercial Managed Care - PPO | Admitting: Obstetrics & Gynecology

## 2022-10-30 ENCOUNTER — Encounter: Payer: Self-pay | Admitting: Obstetrics & Gynecology

## 2022-10-30 ENCOUNTER — Ambulatory Visit: Payer: Commercial Managed Care - PPO | Admitting: Obstetrics & Gynecology

## 2022-10-30 VITALS — BP 134/85 | HR 63 | Ht 64.0 in

## 2022-10-30 DIAGNOSIS — Z3009 Encounter for other general counseling and advice on contraception: Secondary | ICD-10-CM | POA: Diagnosis not present

## 2022-10-30 NOTE — Progress Notes (Signed)
Consult appointment for sterilization:   Chief Complaint  Patient presents with   Pre-op Exam    Wants tubal    Blood pressure 134/85, pulse 63, height 5\' 4"  (1.626 m).  [id number]  On COC-->doing well Is in a relationship and does not want further kids and neither does he Discussed alt BCM, she decided to proceed with bilateral salpingectomy   MEDS ordered this encounter: No orders of the defined types were placed in this encounter.   Orders for this encounter: No orders of the defined types were placed in this encounter.   Impression + Management Plan   ICD-10-CM   1. Encounter for consultation for female sterilization  Z30.09    12/24/22 RA BS      Follow Up: Return in about 2 months (around 01/02/2023) for MyChart Connect visit, Post Op, with Dr Despina Hidden.     All questions were answered.  Past Medical History:  Diagnosis Date   UTI (urinary tract infection) in pregnancy in first trimester 05/14/2012   Wisdom teeth extracted 2007    Past Surgical History:  Procedure Laterality Date   CHOLECYSTECTOMY N/A 09/21/2020   Procedure: LAPAROSCOPIC CHOLECYSTECTOMY;  Surgeon: Franky Macho, MD;  Location: [facility];  Service: General;  Laterality: N/A;   NO PAST SURGERIES     WISDOM TOOTH EXTRACTION      OB History     Gravida  4   Para  2   Term  2   Preterm      AB  2   Living  2      SAB  2   IAB      Ectopic      Multiple  0   Live Births  2           No Known Allergies  Social History   Socioeconomic History   Marital status: Married    Spouse name: Sakia Brizuela   Number of children: 1   Years of education: Not on file   Highest education level: Not on file  Occupational History    Comment: [employer]   Tobacco Use   Smoking status: Never   Smokeless tobacco: Never  Vaping Use   Vaping status: Never Used  Substance and Sexual Activity   Alcohol use: No   Drug use: No   Sexual activity: Yes    Birth control/protection:  Injection  Other Topics Concern   Not on file  Social History Narrative   Not on file   Social Determinants of Health   Financial Resource Strain: Not on file  Food Insecurity: Not on file  Transportation Needs: Not on file  Physical Activity: Not on file  Stress: Not on file  Social Connections: Not on file    Family History  Problem Relation Age of Onset   Diabetes Mother    Hypertension Mother    Asthma Father    COPD Father    Stroke Maternal Grandfather    Cancer Maternal Grandfather

## 2022-10-31 ENCOUNTER — Encounter: Payer: Self-pay | Admitting: Obstetrics & Gynecology

## 2022-12-18 ENCOUNTER — Other Ambulatory Visit: Payer: Self-pay | Admitting: Obstetrics & Gynecology

## 2022-12-18 DIAGNOSIS — Z01818 Encounter for other preprocedural examination: Secondary | ICD-10-CM

## 2022-12-19 NOTE — Patient Instructions (Signed)
Mary WOLLAN  12/19/2022     @PREFPERIOPPHARMACY @   Your procedure is scheduled on  12/24/2022.   Report to [facility] at  0900  A.M.   Call this number if you have problems the morning of surgery:  [phone]  If you experience any cold or flu symptoms such as cough, fever, chills, shortness of breath, etc. between now and your scheduled surgery, please notify us at the above number.   Remember:  Do not eat after midnight.   You may drink clear liquids until 0700 am on 12/24/2022.   Clear liquids allowed are:                    Water, Juice (No red color; non-citric and without pulp; diabetics please choose diet or no sugar options), Carbonated beverages (diabetics please choose diet or no sugar options), Clear Tea (No creamer, milk, or cream, including half & half and powdered creamer), Black Coffee Only (No creamer, milk or cream, including half & half and powdered creamer), Plain Jell-O Only (No red color; diabetics please choose no sugar options), Clear Sports drink (No red color; diabetics please choose diet or no sugar options), and Plain Popsicles Only (No red color; diabetics please choose no sugar options)       At 0700 am on 12/24/2022 drink your carb drink. You can have noting else to drink after this.     Take these medicines the morning of surgery with A SIP OF WATER                                                  None.     Do not wear jewelry, make-up or nail polish, including gel polish,  artificial nails, or any other type of covering on natural nails (fingers and  toes).  Do not wear lotions, powders, or perfumes, or deodorant.  Do not shave 48 hours prior to surgery.  Men may shave face and neck.  Do not bring valuables to the hospital.  [facility] is not responsible for any belongings or valuables.  Contacts, dentures or bridgework may not be worn into surgery.  Leave your suitcase in the car.  After surgery it may be brought to your  room.  For patients admitted to the hospital, discharge time will be determined by your treatment team.  Patients discharged the day of surgery will not be allowed to drive home and must have someone with them for 24 hours.    Special instructions:   DO NOT smoke tobacco or vape for 24 hours before your procedure.  Please read over the following fact sheets that you were given. Coughing and Deep Breathing, Surgical Site Infection Prevention, Anesthesia Post-op Instructions, and Care and Recovery After Surgery        Salpingectomy, Care After The following information offers guidance on how to care for yourself after your procedure. Your health care provider may also give you more specific instructions. If you have problems or questions, contact your health care provider. What can I expect after the procedure? After the procedure, it is common to have: Pain in your abdomen. Light vaginal bleeding (spotting) for a few days. Tiredness. Your recovery time will depend on which method was used for your surgery. Follow these instructions at home: Medicines Take over-the-counter  and prescription medicines only as told by your health care provider. Ask your health care provider if the medicine prescribed to you: Requires you to avoid driving or using machinery. Can cause constipation. You may need to take actions to prevent or treat constipation, such as: Drink enough fluid to keep your urine pale yellow. Take over-the-counter or prescription medicines. Eat foods that are high in fiber, such as beans, whole grains, and fresh fruits and vegetables. Limit foods that are high in fat and processed sugars, such as fried or sweet foods. Incision care  Follow instructions from your health care provider about how to take care of your incision or incisions. Make sure you: Wash your hands with soap and water for at least 20 seconds before and after you change your bandage (dressing). If soap and  water are not available, use hand sanitizer. Change or remove your dressing as told by your health care provider. Leave stitches (sutures), skin glue, staples, or adhesive strips in place. These skin closures may need to stay in place for 2 weeks or longer. If adhesive strip edges start to loosen and curl up, you may trim the loose edges. Do not remove adhesive strips completely unless your health care provider tells you to do that. Keep your dressing clean and dry. Check your incision area every day for signs of infection. Check for: Redness, swelling, or pain that gets worse. Fluid or blood. Warmth. Pus or a bad smell. Activity Rest as told by your health care provider. Avoid sitting for a long time without moving. Get up to take short walks every 1-2 hours. This is important to improve blood flow and breathing. Ask for help if you feel weak or unsteady. Return to your normal activities as told by your health care provider. Ask your health care provider what activities are safe for you. Do not drive until your health care provider says that it is safe. Do not lift anything that is heavier than 10 lb (4.5 kg), or the limit that you are told, until your health care provider says that it is safe. This may last for 2-6 weeks depending on your surgery. Do not douche, use tampons, or have sex until your health care provider approves. General instructions Do not use any products that contain nicotine or tobacco. These products include cigarettes, chewing tobacco, and vaping devices, such as e-cigarettes. These can delay healing after surgery. If you need help quitting, ask your health care provider. Wear compression stockings as told by your health care provider. These stockings help to prevent blood clots and reduce swelling in your legs. Do not take baths, swim, or use a hot tub until your health care provider approves. You may take showers. Keep all follow-up visits. This is important. Contact a  health care provider if: You have pain when you urinate. You have redness, swelling, or more pain around an incision or an incision feels warm to the touch. You have pus, fluid, blood, or a bad smell coming from an incision or an incision starts to open. You have a fever. You have abdominal pain that gets worse or does not get better with medicine. You have a rash. You feel light-headed, have nausea and vomiting, or both. Get help right away if: You have pain in your chest or leg. You develop shortness of breath. You faint. You have increased or heavy vaginal bleeding, such as soaking a sanitary napkin in an hour. These symptoms may represent a serious problem that is an  emergency. Do not wait to see if the symptoms will go away. Get medical help right away. Call your local emergency services (911 in the [place] Do not drive yourself to the hospital. Summary After the procedure, it is common to feel tired, have pain in your abdomen, and have light vaginal bleeding for a few days. Follow instructions from your health care provider about how to take care of your incision or incisions. Return to your normal activities as told by your health care provider. Ask your health care provider what activities are safe for you. Do not douche, use tampons, or have sex until your health care provider approves. Keep all follow-up visits. This is important. This information is not intended to replace advice given to you by your health care provider. Make sure you discuss any questions you have with your health care provider. Document Revised: 12/19/2019 Document Reviewed: 12/20/2019 Elsevier Patient Education  2024 [employer] General Anesthesia, Adult, Care After The following information offers guidance on how to care for yourself after your procedure. Your health care provider may also give you more specific instructions. If you have problems or questions, contact your health care provider. What can I  expect after the procedure? After the procedure, it is common for people to: Have pain or discomfort at the IV site. Have nausea or vomiting. Have a sore throat or hoarseness. Have trouble concentrating. Feel cold or chills. Feel weak, sleepy, or tired (fatigue). Have soreness and body aches. These can affect parts of the body that were not involved in surgery. Follow these instructions at home: For the time period you were told by your health care provider:  Rest. Do not participate in activities where you could fall or become injured. Do not drive or use machinery. Do not drink alcohol. Do not take sleeping pills or medicines that cause drowsiness. Do not make important decisions or sign legal documents. Do not take care of children on your own. General instructions Drink enough fluid to keep your urine pale yellow. If you have sleep apnea, surgery and certain medicines can increase your risk for breathing problems. Follow instructions from your health care provider about wearing your sleep device: Anytime you are sleeping, including during daytime naps. While taking prescription pain medicines, sleeping medicines, or medicines that make you drowsy. Return to your normal activities as told by your health care provider. Ask your health care provider what activities are safe for you. Take over-the-counter and prescription medicines only as told by your health care provider. Do not use any products that contain nicotine or tobacco. These products include cigarettes, chewing tobacco, and vaping devices, such as e-cigarettes. These can delay incision healing after surgery. If you need help quitting, ask your health care provider. Contact a health care provider if: You have nausea or vomiting that does not get better with medicine. You vomit every time you eat or drink. You have pain that does not get better with medicine. You cannot urinate or have bloody urine. You develop a skin  rash. You have a fever. Get help right away if: You have trouble breathing. You have chest pain. You vomit blood. These symptoms may be an emergency. Get help right away. Call 911. Do not wait to see if the symptoms will go away. Do not drive yourself to the hospital. Summary After the procedure, it is common to have a sore throat, hoarseness, nausea, vomiting, or to feel weak, sleepy, or fatigue. For the time period you were told by  your health care provider, do not drive or use machinery. Get help right away if you have difficulty breathing, have chest pain, or vomit blood. These symptoms may be an emergency. This information is not intended to replace advice given to you by your health care provider. Make sure you discuss any questions you have with your health care provider. Document Revised: 04/26/2021 Document Reviewed: 04/26/2021 Elsevier Patient Education  2024 [employer]. How to Use Chlorhexidine Before Surgery Chlorhexidine gluconate (CHG) is a germ-killing (antiseptic) solution that is used to clean the skin. It can get rid of the bacteria that normally live on the skin and can keep them away for about 24 hours. To clean your skin with CHG, you may be given: A CHG solution to use in the shower or as part of a sponge bath. A prepackaged cloth that contains CHG. Cleaning your skin with CHG may help lower the risk for infection: While you are staying in the intensive care unit of the hospital. If you have a vascular access, such as a central line, to provide short-term or long-term access to your veins. If you have a catheter to drain urine from your bladder. If you are on a ventilator. A ventilator is a machine that helps you breathe by moving air in and out of your lungs. After surgery. What are the risks? Risks of using CHG include: A skin reaction. Hearing loss, if CHG gets in your ears and you have a perforated eardrum. Eye injury, if CHG gets in your eyes and is not  rinsed out. The CHG product catching fire. Make sure that you avoid smoking and flames after applying CHG to your skin. Do not use CHG: If you have a chlorhexidine allergy or have previously reacted to chlorhexidine. On babies younger than 41 months of age. How to use CHG solution Use CHG only as told by your health care provider, and follow the instructions on the label. Use the full amount of CHG as directed. Usually, this is one bottle. During a shower Follow these steps when using CHG solution during a shower (unless your health care provider gives you different instructions): Start the shower. Use your normal soap and shampoo to wash your face and hair. Turn off the shower or move out of the shower stream. Pour the CHG onto a clean washcloth. Do not use any type of brush or rough-edged sponge. Starting at your neck, lather your body down to your toes. Make sure you follow these instructions: If you will be having surgery, pay special attention to the part of your body where you will be having surgery. Scrub this area for at least 1 minute. Do not use CHG on your head or face. If the solution gets into your ears or eyes, rinse them well with water. Avoid your genital area. Avoid any areas of skin that have broken skin, cuts, or scrapes. Scrub your back and under your arms. Make sure to wash skin folds. Let the lather sit on your skin for 1-2 minutes or as long as told by your health care provider. Thoroughly rinse your entire body in the shower. Make sure that all body creases and crevices are rinsed well. Dry off with a clean towel. Do not put any substances on your body afterward--such as powder, lotion, or perfume--unless you are told to do so by your health care provider. Only use lotions that are recommended by the manufacturer. Put on clean clothes or pajamas. If it is the night before  your surgery, sleep in clean sheets.  During a sponge bath Follow these steps when using CHG  solution during a sponge bath (unless your health care provider gives you different instructions): Use your normal soap and shampoo to wash your face and hair. Pour the CHG onto a clean washcloth. Starting at your neck, lather your body down to your toes. Make sure you follow these instructions: If you will be having surgery, pay special attention to the part of your body where you will be having surgery. Scrub this area for at least 1 minute. Do not use CHG on your head or face. If the solution gets into your ears or eyes, rinse them well with water. Avoid your genital area. Avoid any areas of skin that have broken skin, cuts, or scrapes. Scrub your back and under your arms. Make sure to wash skin folds. Let the lather sit on your skin for 1-2 minutes or as long as told by your health care provider. Using a different clean, wet washcloth, thoroughly rinse your entire body. Make sure that all body creases and crevices are rinsed well. Dry off with a clean towel. Do not put any substances on your body afterward--such as powder, lotion, or perfume--unless you are told to do so by your health care provider. Only use lotions that are recommended by the manufacturer. Put on clean clothes or pajamas. If it is the night before your surgery, sleep in clean sheets. How to use CHG prepackaged cloths Only use CHG cloths as told by your health care provider, and follow the instructions on the label. Use the CHG cloth on clean, dry skin. Do not use the CHG cloth on your head or face unless your health care provider tells you to. When washing with the CHG cloth: Avoid your genital area. Avoid any areas of skin that have broken skin, cuts, or scrapes. Before surgery Follow these steps when using a CHG cloth to clean before surgery (unless your health care provider gives you different instructions): Using the CHG cloth, vigorously scrub the part of your body where you will be having surgery. Scrub using a  back-and-forth motion for 3 minutes. The area on your body should be completely wet with CHG when you are done scrubbing. Do not rinse. Discard the cloth and let the area air-dry. Do not put any substances on the area afterward, such as powder, lotion, or perfume. Put on clean clothes or pajamas. If it is the night before your surgery, sleep in clean sheets.  For general bathing Follow these steps when using CHG cloths for general bathing (unless your health care provider gives you different instructions). Use a separate CHG cloth for each area of your body. Make sure you wash between any folds of skin and between your fingers and toes. Wash your body in the following order, switching to a new cloth after each step: The front of your neck, shoulders, and chest. Both of your arms, under your arms, and your hands. Your stomach and groin area, avoiding the genitals. Your right leg and foot. Your left leg and foot. The back of your neck, your back, and your buttocks. Do not rinse. Discard the cloth and let the area air-dry. Do not put any substances on your body afterward--such as powder, lotion, or perfume--unless you are told to do so by your health care provider. Only use lotions that are recommended by the manufacturer. Put on clean clothes or pajamas. Contact a health care provider if: Your skin  gets irritated after scrubbing. You have questions about using your solution or cloth. You swallow any chlorhexidine. Call your local poison control center ((220)810-0623 in the [place] Get help right away if: Your eyes itch badly, or they become very red or swollen. Your skin itches badly and is red or swollen. Your hearing changes. You have trouble seeing. You have swelling or tingling in your mouth or throat. You have trouble breathing. These symptoms may represent a serious problem that is an emergency. Do not wait to see if the symptoms will go away. Get medical help right away. Call your  local emergency services (911 in the [place] Do not drive yourself to the hospital. Summary Chlorhexidine gluconate (CHG) is a germ-killing (antiseptic) solution that is used to clean the skin. Cleaning your skin with CHG may help to lower your risk for infection. You may be given CHG to use for bathing. It may be in a bottle or in a prepackaged cloth to use on your skin. Carefully follow your health care provider's instructions and the instructions on the product label. Do not use CHG if you have a chlorhexidine allergy. Contact your health care provider if your skin gets irritated after scrubbing. This information is not intended to replace advice given to you by your health care provider. Make sure you discuss any questions you have with your health care provider. Document Revised: 05/27/2021 Document Reviewed: 04/09/2020 Elsevier Patient Education  2023 [employer].

## 2022-12-22 ENCOUNTER — Encounter (HOSPITAL_COMMUNITY): Payer: Self-pay | Admitting: *Deleted

## 2022-12-22 ENCOUNTER — Encounter (HOSPITAL_COMMUNITY)
Admission: RE | Admit: 2022-12-22 | Discharge: 2022-12-22 | Disposition: A | Discharge status: Home / Self Care | Payer: Commercial Managed Care - PPO | Source: Ambulatory Visit | Attending: Obstetrics & Gynecology | Admitting: Obstetrics & Gynecology

## 2022-12-22 ENCOUNTER — Other Ambulatory Visit: Payer: Self-pay

## 2022-12-22 DIAGNOSIS — Z01812 Encounter for preprocedural laboratory examination: Secondary | ICD-10-CM | POA: Insufficient documentation

## 2022-12-22 DIAGNOSIS — Z01818 Encounter for other preprocedural examination: Secondary | ICD-10-CM

## 2022-12-22 LAB — URINALYSIS, ROUTINE W REFLEX MICROSCOPIC
Bilirubin Urine: NEGATIVE
Glucose, UA: NEGATIVE mg/dL
Ketones, ur: NEGATIVE mg/dL
Nitrite: NEGATIVE
Protein, ur: 30 mg/dL — AB
Specific Gravity, Urine: 1.024 (ref 1.005–1.030)
pH: 6 (ref 5.0–8.0)

## 2022-12-22 LAB — COMPREHENSIVE METABOLIC PANEL
ALT: 18 U/L (ref 0–44)
AST: 18 U/L (ref 15–41)
Albumin: 3.6 g/dL (ref 3.5–5.0)
Alkaline Phosphatase: 49 U/L (ref 38–126)
Anion gap: 8 (ref 5–15)
BUN: 9 mg/dL (ref 6–20)
CO2: 23 mmol/L (ref 22–32)
Calcium: 8.5 mg/dL — ABNORMAL LOW (ref 8.9–10.3)
Chloride: 106 mmol/L (ref 98–111)
Creatinine, Ser: 0.76 mg/dL (ref 0.44–1.00)
GFR, Estimated: >60 mL/min (ref >60)
Glucose, Bld: 96 mg/dL (ref 70–99)
Potassium: 4 mmol/L (ref 3.5–5.1)
Sodium: 137 mmol/L (ref 135–145)
Total Bilirubin: 0.5 mg/dL (ref <1.2)
Total Protein: 7.2 g/dL (ref 6.5–8.1)

## 2022-12-22 LAB — CBC
HCT: 38.9 % (ref 36.0–46.0)
Hemoglobin: 13.2 g/dL (ref 12.0–15.0)
MCH: 30.6 pg (ref 26.0–34.0)
MCHC: 33.9 g/dL (ref 30.0–36.0)
MCV: 90 fL (ref 80.0–100.0)
Platelets: 291 10*3/uL (ref 150–400)
RBC: 4.32 MIL/uL (ref 3.87–5.11)
RDW: 12.1 % (ref 11.5–15.5)
WBC: 7.9 10*3/uL (ref 4.0–10.5)
nRBC: 0 % (ref 0.0–0.2)

## 2022-12-22 LAB — RAPID HIV SCREEN (HIV 1/2 AB+AG)
HIV 1/2 Antibodies: NONREACTIVE
HIV-1 P24 Antigen - HIV24: NONREACTIVE

## 2022-12-22 LAB — PREGNANCY, URINE: Preg Test, Ur: NEGATIVE

## 2022-12-24 ENCOUNTER — Encounter (HOSPITAL_COMMUNITY)
Admission: RE | Disposition: A | Discharge status: Home / Self Care | Payer: Self-pay | Source: Home / Self Care | Attending: Obstetrics & Gynecology

## 2022-12-24 ENCOUNTER — Ambulatory Visit (HOSPITAL_COMMUNITY)
Admission: RE | Admit: 2022-12-24 | Discharge: 2022-12-24 | Disposition: A | Discharge status: Home / Self Care | Payer: Commercial Managed Care - PPO | Attending: Obstetrics & Gynecology | Admitting: Obstetrics & Gynecology

## 2022-12-24 ENCOUNTER — Ambulatory Visit (HOSPITAL_COMMUNITY): Payer: Self-pay | Admitting: Certified Registered"

## 2022-12-24 ENCOUNTER — Ambulatory Visit (HOSPITAL_COMMUNITY): Payer: Commercial Managed Care - PPO | Admitting: Certified Registered"

## 2022-12-24 DIAGNOSIS — Z302 Encounter for sterilization: Secondary | ICD-10-CM

## 2022-12-24 DIAGNOSIS — Z6841 Body Mass Index (BMI) 40.0 and over, adult: Secondary | ICD-10-CM | POA: Diagnosis not present

## 2022-12-24 DIAGNOSIS — Z3009 Encounter for other general counseling and advice on contraception: Secondary | ICD-10-CM | POA: Diagnosis not present

## 2022-12-24 HISTORY — PX: XI ROBOTIC ASSISTED SALPINGECTOMY: SHX6824

## 2022-12-24 SURGERY — SALPINGECTOMY, ROBOT-ASSISTED
Anesthesia: General | Site: Abdomen | Laterality: Bilateral

## 2022-12-24 MED ORDER — PHENYLEPHRINE 80 MCG/ML (10ML) SYRINGE FOR IV PUSH (FOR BLOOD PRESSURE SUPPORT)
PREFILLED_SYRINGE | INTRAVENOUS | Status: DC | PRN
  Administered 2022-12-24: 80 ug via INTRAVENOUS
  Administered 2022-12-24: 160 ug via INTRAVENOUS

## 2022-12-24 MED ORDER — LACTATED RINGERS IV SOLN: INTRAVENOUS | Status: DC | PRN

## 2022-12-24 MED ORDER — DEXAMETHASONE SODIUM PHOSPHATE 10 MG/ML IJ SOLN
INTRAMUSCULAR | Status: AC
  Filled 2022-12-24: qty 1

## 2022-12-24 MED ORDER — FENTANYL CITRATE (PF) 100 MCG/2ML IJ SOLN
INTRAMUSCULAR | Status: AC
  Filled 2022-12-24: qty 2

## 2022-12-24 MED ORDER — PROPOFOL 10 MG/ML IV BOLUS
INTRAVENOUS | Status: AC
  Filled 2022-12-24: qty 20

## 2022-12-24 MED ORDER — OXYCODONE HCL 5 MG/5ML PO SOLN
5.0000 mg | Freq: Once | ORAL | Status: DC | PRN
Start: 2022-12-24 — End: 2022-12-24

## 2022-12-24 MED ORDER — ORAL CARE MOUTH RINSE
15.0000 mL | Freq: Once | OROMUCOSAL | Status: AC
Start: 2022-12-24 — End: 2022-12-24

## 2022-12-24 MED ORDER — ONDANSETRON HCL 4 MG/2ML IJ SOLN
INTRAMUSCULAR | Status: AC
  Filled 2022-12-24: qty 2

## 2022-12-24 MED ORDER — OXYCODONE-ACETAMINOPHEN 7.5-325 MG PO TABS: 1.0000 | ORAL_TABLET | Freq: Four times a day (QID) | ORAL | 0 refills | Status: DC | PRN

## 2022-12-24 MED ORDER — BUPIVACAINE HCL (PF) 0.25 % IJ SOLN
INTRAMUSCULAR | Status: AC
  Filled 2022-12-24: qty 60

## 2022-12-24 MED ORDER — SUGAMMADEX SODIUM 200 MG/2ML IV SOLN
INTRAVENOUS | Status: DC | PRN
  Administered 2022-12-24: 200 mg via INTRAVENOUS

## 2022-12-24 MED ORDER — HYDROMORPHONE HCL 1 MG/ML IJ SOLN
INTRAMUSCULAR | Status: AC
  Filled 2022-12-24: qty 1

## 2022-12-24 MED ORDER — LIDOCAINE HCL (CARDIAC) PF 100 MG/5ML IV SOSY
PREFILLED_SYRINGE | INTRAVENOUS | Status: DC | PRN
  Administered 2022-12-24: 60 mg via INTRAVENOUS

## 2022-12-24 MED ORDER — CEFAZOLIN SODIUM-DEXTROSE 2-4 GM/100ML-% IV SOLN
2.0000 g | INTRAVENOUS | Status: AC
  Administered 2022-12-24: 2 g via INTRAVENOUS
  Filled 2022-12-24: qty 100

## 2022-12-24 MED ORDER — STERILE WATER FOR IRRIGATION IR SOLN
Status: DC | PRN
  Administered 2022-12-24: 500 mL

## 2022-12-24 MED ORDER — FENTANYL CITRATE (PF) 100 MCG/2ML IJ SOLN
INTRAMUSCULAR | Status: DC | PRN
  Administered 2022-12-24: 100 ug via INTRAVENOUS
  Administered 2022-12-24: 50 ug via INTRAVENOUS

## 2022-12-24 MED ORDER — CHLORHEXIDINE GLUCONATE 0.12 % MT SOLN
15.0000 mL | Freq: Once | OROMUCOSAL | Status: AC
  Administered 2022-12-24: 15 mL via OROMUCOSAL

## 2022-12-24 MED ORDER — POVIDONE-IODINE 10 % EX SWAB: 2.0000 | Freq: Once | CUTANEOUS | Status: DC

## 2022-12-24 MED ORDER — KETOROLAC TROMETHAMINE 10 MG PO TABS: 10.0000 mg | ORAL_TABLET | Freq: Three times a day (TID) | ORAL | 0 refills | Status: DC | PRN

## 2022-12-24 MED ORDER — BUPIVACAINE HCL (PF) 0.25 % IJ SOLN
INTRAMUSCULAR | Status: DC | PRN
  Administered 2022-12-24: 40 mL

## 2022-12-24 MED ORDER — LIDOCAINE HCL (PF) 2 % IJ SOLN
INTRAMUSCULAR | Status: AC
  Filled 2022-12-24: qty 5

## 2022-12-24 MED ORDER — KETOROLAC TROMETHAMINE 30 MG/ML IJ SOLN
30.0000 mg | Freq: Once | INTRAMUSCULAR | Status: AC
  Administered 2022-12-24: 30 mg via INTRAVENOUS
  Filled 2022-12-24: qty 1

## 2022-12-24 MED ORDER — OXYCODONE HCL 5 MG PO TABS: 5.0000 mg | ORAL_TABLET | Freq: Once | ORAL | Status: DC | PRN

## 2022-12-24 MED ORDER — ONDANSETRON HCL 4 MG/2ML IJ SOLN: 4.0000 mg | Freq: Once | INTRAMUSCULAR | Status: DC | PRN

## 2022-12-24 MED ORDER — PHENYLEPHRINE 80 MCG/ML (10ML) SYRINGE FOR IV PUSH (FOR BLOOD PRESSURE SUPPORT)
PREFILLED_SYRINGE | INTRAVENOUS | Status: AC
  Filled 2022-12-24: qty 10

## 2022-12-24 MED ORDER — PROPOFOL 10 MG/ML IV BOLUS
INTRAVENOUS | Status: DC | PRN
  Administered 2022-12-24: 170 mg via INTRAVENOUS

## 2022-12-24 MED ORDER — ONDANSETRON HCL 4 MG/2ML IJ SOLN
INTRAMUSCULAR | Status: DC | PRN
  Administered 2022-12-24: 4 mg via INTRAVENOUS

## 2022-12-24 MED ORDER — MIDAZOLAM HCL 2 MG/2ML IJ SOLN
INTRAMUSCULAR | Status: DC | PRN
  Administered 2022-12-24: 2 mg via INTRAVENOUS

## 2022-12-24 MED ORDER — DEXAMETHASONE SODIUM PHOSPHATE 10 MG/ML IJ SOLN
INTRAMUSCULAR | Status: DC | PRN
  Administered 2022-12-24: 10 mg via INTRAVENOUS

## 2022-12-24 MED ORDER — MORPHINE SULFATE (PF) 2 MG/ML IV SOLN
1.0000 mg | INTRAVENOUS | Status: DC | PRN
Start: 2022-12-24 — End: 2022-12-24

## 2022-12-24 MED ORDER — MIDAZOLAM HCL 2 MG/2ML IJ SOLN
INTRAMUSCULAR | Status: AC
  Filled 2022-12-24: qty 2

## 2022-12-24 MED ORDER — HYDROMORPHONE HCL 1 MG/ML IJ SOLN
INTRAMUSCULAR | Status: DC | PRN
  Administered 2022-12-24 (×2): .5 mg via INTRAVENOUS

## 2022-12-24 MED ORDER — ROCURONIUM BROMIDE 10 MG/ML (PF) SYRINGE
PREFILLED_SYRINGE | INTRAVENOUS | Status: DC | PRN
  Administered 2022-12-24: 60 mg via INTRAVENOUS

## 2022-12-24 MED ORDER — ONDANSETRON 8 MG PO TBDP: 8.0000 mg | ORAL_TABLET | Freq: Three times a day (TID) | ORAL | 0 refills | Status: DC | PRN

## 2022-12-24 SURGICAL SUPPLY — 45 items
BLADE SURG SZ11 CARB STEEL (BLADE) ×1 IMPLANT
CAUTERY HOOK MNPLR 1.6 DVNC XI (INSTRUMENTS) IMPLANT
COVER LIGHT HANDLE STERIS (MISCELLANEOUS) ×2 IMPLANT
COVER MAYO STAND XLG (MISCELLANEOUS) ×1 IMPLANT
DERMABOND ADVANCED .7 DNX12 (GAUZE/BANDAGES/DRESSINGS) ×1 IMPLANT
DRAPE ARM DVNC X/XI (DISPOSABLE) ×3 IMPLANT
DRAPE COLUMN DVNC XI (DISPOSABLE) ×1 IMPLANT
ELECT REM PT RETURN 9FT ADLT (ELECTROSURGICAL) ×1
ELECTRODE REM PT RTRN 9FT ADLT (ELECTROSURGICAL) ×1 IMPLANT
FORCEPS PROGRASP DVNC XI (FORCEP) ×1 IMPLANT
GLOVE BIOGEL PI IND STRL 7.0 (GLOVE) ×3 IMPLANT
GLOVE BIOGEL PI IND STRL 8 (GLOVE) ×2 IMPLANT
GLOVE ECLIPSE 8.0 STRL XLNG CF (GLOVE) ×2 IMPLANT
GOWN STRL REUS W/TWL LRG LVL3 (GOWN DISPOSABLE) ×2 IMPLANT
GOWN STRL REUS W/TWL XL LVL3 (GOWN DISPOSABLE) ×2 IMPLANT
IRRIGATION STRYKERFLOW (MISCELLANEOUS) IMPLANT
IRRIGATOR STRYKERFLOW (MISCELLANEOUS)
KIT PINK PAD W/HEAD ARE REST (MISCELLANEOUS) ×1
KIT PINK PAD W/HEAD ARM REST (MISCELLANEOUS) ×1 IMPLANT
KIT TURNOVER KIT A (KITS) ×1 IMPLANT
MANIFOLD NEPTUNE II (INSTRUMENTS) ×1 IMPLANT
NDL HYPO 21X1.5 SAFETY (NEEDLE) ×1 IMPLANT
NDL INSUFFLATION 14GA 120MM (NEEDLE) ×1 IMPLANT
NEEDLE HYPO 21X1.5 SAFETY (NEEDLE) ×1
NEEDLE INSUFFLATION 14GA 120MM (NEEDLE) ×1
NS IRRIG 1000ML POUR BTL (IV SOLUTION) ×1 IMPLANT
OBTURATOR OPTICAL STND 8 DVNC (TROCAR) ×1
OBTURATOR OPTICALSTD 8 DVNC (TROCAR) ×1 IMPLANT
PACK LAP CHOLE LZT030E (CUSTOM PROCEDURE TRAY) ×1 IMPLANT
POSITIONER HEAD 8X9X4 ADT (SOFTGOODS) ×1 IMPLANT
SEAL UNIV 5-12 XI (MISCELLANEOUS) ×3 IMPLANT
SEALER VESSEL EXT DVNC XI (MISCELLANEOUS) ×1 IMPLANT
SET BASIN LINEN APH (SET/KITS/TRAYS/PACK) ×1 IMPLANT
SET TUBE DA VINCI INSUFFLATOR (TUBING) IMPLANT
SOL ANTI FOG 6CC (MISCELLANEOUS) ×1 IMPLANT
SUT VICRYL 0 UR6 27IN ABS (SUTURE) IMPLANT
SUT VICRYL AB 3-0 FS1 BRD 27IN (SUTURE) ×1 IMPLANT
SUT VLOC 90 S/L VL9 GS22 (SUTURE) IMPLANT
SYR 20ML LL LF (SYRINGE) ×1 IMPLANT
SYS RETRIEVAL 5MM INZII UNIV (BASKET) ×1
SYSTEM RETRIEVL 5MM INZII UNIV (BASKET) ×1 IMPLANT
TAPE TRANSPORE STRL 2 31045 (GAUZE/BANDAGES/DRESSINGS) ×1 IMPLANT
TRAY FOL W/BAG SLVR 16FR STRL (SET/KITS/TRAYS/PACK) ×1 IMPLANT
TRAY FOLEY W/BAG SLVR 16FR LF (SET/KITS/TRAYS/PACK)
WATER STERILE IRR 500ML POUR (IV SOLUTION) ×1 IMPLANT

## 2022-12-24 NOTE — H&P (Signed)
Preoperative History and Physical  Mary [name] is a 37 y.o. [phone] with Patient's last menstrual period was 11/15/2022. admitted for a robot assisted laparoscopic bilateral salpingectomy for sterilization.    PMH:    Past Medical History:  Diagnosis Date   UTI (urinary tract infection) in pregnancy in first trimester 05/14/2012   Wisdom teeth extracted 2007    PSH:     Past Surgical History:  Procedure Laterality Date   CHOLECYSTECTOMY N/A 09/21/2020   Procedure: LAPAROSCOPIC CHOLECYSTECTOMY;  Surgeon: Franky Macho, MD;  Location: [facility];  Service: General;  Laterality: N/A;   NO PAST SURGERIES     WISDOM TOOTH EXTRACTION      POb/GynH:      OB History     Gravida  4   Para  2   Term  2   Preterm      AB  2   Living  2      SAB  2   IAB      Ectopic      Multiple  0   Live Births  2           SH:   Social History   Tobacco Use   Smoking status: Never   Smokeless tobacco: Never  Vaping Use   Vaping status: Never Used  Substance Use Topics   Alcohol use: No   Drug use: No    FH:    Family History  Problem Relation Age of Onset   Diabetes Mother    Hypertension Mother    Asthma Father    COPD Father    Stroke Maternal Grandfather    Cancer Maternal Grandfather      Allergies: No Known Allergies  Medications:       Current Facility-Administered Medications:    ceFAZolin (ANCEF) IVPB 2g/100 mL premix, 2 g, Intravenous, On Call to OR, Lazaro Arms, MD   povidone-iodine 10 % swab 2 Application, 2 Application, Topical, Once, Lazaro Arms, MD  Review of Systems:   Review of Systems  Constitutional: Negative for fever, chills, weight loss, malaise/fatigue and diaphoresis.  HENT: Negative for hearing loss, ear pain, nosebleeds, congestion, sore throat, neck pain, tinnitus and ear discharge.   Eyes: Negative for blurred vision, double vision, photophobia, pain, discharge and redness.  Respiratory: Negative for cough, hemoptysis,  sputum production, shortness of breath, wheezing and stridor.   Cardiovascular: Negative for chest pain, palpitations, orthopnea, claudication, leg swelling and PND.  Gastrointestinal: Positive for abdominal pain. Negative for heartburn, nausea, vomiting, diarrhea, constipation, blood in stool and melena.  Genitourinary: Negative for dysuria, urgency, frequency, hematuria and flank pain.  Musculoskeletal: Negative for myalgias, back pain, joint pain and falls.  Skin: Negative for itching and rash.  Neurological: Negative for dizziness, tingling, tremors, sensory change, speech change, focal weakness, seizures, loss of consciousness, weakness and headaches.  Endo/Heme/Allergies: Negative for environmental allergies and polydipsia. Does not bruise/bleed easily.  Psychiatric/Behavioral: Negative for depression, suicidal ideas, hallucinations, memory loss and substance abuse. The patient is not nervous/anxious and does not have insomnia.      PHYSICAL EXAM:  Blood pressure 114/88, pulse 72, temperature 98.4 F (36.9 C), temperature source Oral, resp. rate 18, height 5\' 4"  (1.626 m), weight 94.3 kg, last menstrual period 11/15/2022, SpO2 98%.    Vitals reviewed. Constitutional: She is oriented to person, place, and time. She appears well-developed and well-nourished.  HENT:  Head: Normocephalic and atraumatic.  Right Ear: External ear normal.  Left Ear:  External ear normal.  Nose: Nose normal.  Mouth/Throat: Oropharynx is clear and moist.  Eyes: Conjunctivae and EOM are normal. Pupils are equal, round, and reactive to light. Right eye exhibits no discharge. Left eye exhibits no discharge. No scleral icterus.  Neck: Normal range of motion. Neck supple. No tracheal deviation present. No thyromegaly present.  Cardiovascular: Normal rate, regular rhythm, normal heart sounds and intact distal pulses.  Exam reveals no gallop and no friction rub.   No murmur heard. Respiratory: Effort normal and  breath sounds normal. No respiratory distress. She has no wheezes. She has no rales. She exhibits no tenderness.  GI: Soft. Bowel sounds are normal. She exhibits no distension and no mass. There is tenderness. There is no rebound and no guarding.  Genitourinary:       Vulva is normal without lesions Vagina is pink moist without discharge Cervix normal in appearance and pap is normal Uterus is normal size, contour, position, consistency, mobility, non-tender Adnexa is negative with normal sized ovaries by sonogram  Musculoskeletal: Normal range of motion. She exhibits no edema and no tenderness.  Neurological: She is alert and oriented to person, place, and time. She has normal reflexes. She displays normal reflexes. No cranial nerve deficit. She exhibits normal muscle tone. Coordination normal.  Skin: Skin is warm and dry. No rash noted. No erythema. No pallor.  Psychiatric: She has a normal mood and affect. Her behavior is normal. Judgment and thought content normal.    Labs: Results for orders placed or performed during the hospital encounter of 12/22/22 (from the past 336 hour(s))  CBC   Collection Time: 12/22/22  8:33 AM  Result Value Ref Range   WBC 7.9 4.0 - 10.5 K/uL   RBC 4.32 3.87 - 5.11 MIL/uL   Hemoglobin 13.2 12.0 - 15.0 g/dL   HCT 16.1 09.6 - 04.5 %   MCV 90.0 80.0 - 100.0 fL   MCH 30.6 26.0 - 34.0 pg   MCHC 33.9 30.0 - 36.0 g/dL   RDW 40.9 81.1 - 91.4 %   Platelets 291 150 - 400 K/uL   nRBC 0.0 0.0 - 0.2 %  Comprehensive metabolic panel   Collection Time: 12/22/22  8:33 AM  Result Value Ref Range   Sodium 137 135 - 145 mmol/L   Potassium 4.0 3.5 - 5.1 mmol/L   Chloride 106 98 - 111 mmol/L   CO2 23 22 - 32 mmol/L   Glucose, Bld 96 70 - 99 mg/dL   BUN 9 6 - 20 mg/dL   Creatinine, Ser 7.82 0.44 - 1.00 mg/dL   Calcium 8.5 (L) 8.9 - 10.3 mg/dL   Total Protein 7.2 6.5 - 8.1 g/dL   Albumin 3.6 3.5 - 5.0 g/dL   AST 18 15 - 41 U/L   ALT 18 0 - 44 U/L   Alkaline  Phosphatase 49 38 - 126 U/L   Total Bilirubin 0.5 <1.2 mg/dL   GFR, Estimated >95 >62 mL/min   Anion gap 8 5 - 15  Rapid HIV screen (HIV 1/2 Ab+Ag)   Collection Time: 12/22/22  8:33 AM  Result Value Ref Range   HIV-1 P24 Antigen - HIV24 NON REACTIVE NON REACTIVE   HIV 1/2 Antibodies NON REACTIVE NON REACTIVE   Interpretation (HIV Ag Ab)      A non reactive test result means that HIV 1 or HIV 2 antibodies and HIV 1 p24 antigen were not detected in the specimen.  Urinalysis, Routine w reflex microscopic -Urine, Clean  Catch   Collection Time: 12/22/22  8:33 AM  Result Value Ref Range   Color, Urine YELLOW YELLOW   APPearance CLOUDY (A) CLEAR   Specific Gravity, Urine 1.024 1.005 - 1.030   pH 6.0 5.0 - 8.0   Glucose, UA NEGATIVE NEGATIVE mg/dL   Hgb urine dipstick SMALL (A) NEGATIVE   Bilirubin Urine NEGATIVE NEGATIVE   Ketones, ur NEGATIVE NEGATIVE mg/dL   Protein, ur 30 (A) NEGATIVE mg/dL   Nitrite NEGATIVE NEGATIVE   Leukocytes,Ua SMALL (A) NEGATIVE   RBC / HPF 0-5 0 - 5 RBC/hpf   WBC, UA 6-10 0 - 5 WBC/hpf   Bacteria, UA RARE (A) NONE SEEN   Squamous Epithelial / HPF 21-50 0 - 5 /HPF   Mucus PRESENT    Hyaline Casts, UA PRESENT   Pregnancy, urine   Collection Time: 12/22/22  8:33 AM  Result Value Ref Range   Preg Test, Ur NEGATIVE NEGATIVE    EKG: Orders placed or performed during the hospital encounter of 09/16/20   EKG 12-Lead   EKG 12-Lead   EKG    Imaging Studies: No results found.    Assessment: Multiparous female desires permanent sterilization, opts for salpingectomy for ovarian cancer prophylaxis  Plan: Robot assisted laparoscopic bilateral salpingectomy  Lazaro Arms 12/24/2022 7:21 AM

## 2022-12-24 NOTE — Op Note (Signed)
Preoperative Diagnosis:  1.  Multiparous female desires permanent sterilization                                          2.  Elects to have bilateral salpingectomy for ovarian cancer prophylaxis   Postoperative Diagnosis:  Same as above   Procedure:  Xi robotic assisted laparoscopic Bilateral Salpingectomy for the purpose of permanent sterilization                    TAP block   Surgeon:  Rockne Coons MD   Anaesthesia: general   Findings:  Patient had normal pelvic anatomy and no intraperitoneal abnormalities.   Description of Operation:    Patient was taken to the OR and placed into supine position where she underwent general anaesthesia.   She was placed in the dorsal lithotomy position and prepped and draped in the usual sterile fashion.   An incision was made superior to the umbilicus and dissection taken down to the rectus fascia. A Veres needle was used to insufflate the periotneal cavity. An 8 mm non bladed video laparoscope trocar port was then placed under direct visualization without difficulty.   The above noted findings were observed.   Two additional 8 mm non bladed trocar ports were placed to the right and left of the umbilicus lateral to the rectus anterior muscle under direct visualization without difficulty.      The Xi Robot was docked to the ports 0 degree scope was used in the umbilical port A fenestrated bipolar extended was placed in the left port A prograsp was placed in the  right port VSE was used to coagulate and then transect just below the Fallopian tubes bilaterally taking care not to involve the ovarian vascular pedicles The mesosalpinx of both fallopian tubes were hemostatic The robot was undocked The transected fallopian tubes were removed through the left 8 mm port using an alligator grasper   The umbilical incision subcutaneous tissue was reapproximated with 2-0 Vicryl All 3 skin incisions were closed using 3-0 vicryl in a subcuticular  fashion. Dermabond was placed at all 3 surgical sites   TAP block was placed in the usual fashion using 40 cc of 0.5% marcaine plain   The patient was awakened from anaesthesia and taken to the PACU with all counts being correct x 3.     The patient received  2 gram of ancef andToradol 30 mg IV preoperatively.      Blood loss was 0 cc    Lazaro Arms 12/24/2022 8:35 AM

## 2022-12-24 NOTE — Anesthesia Procedure Notes (Signed)
Procedure Name: Intubation Date/Time: 12/24/2022 7:42 AM  Performed by: Oletha Cruel, CRNAPre-anesthesia Checklist: Patient identified, Emergency Drugs available, Suction available and Patient being monitored Patient Re-evaluated:Patient Re-evaluated prior to induction Oxygen Delivery Method: Circle system utilized Preoxygenation: Pre-oxygenation with 100% oxygen Induction Type: IV induction Ventilation: Mask ventilation without difficulty Laryngoscope Size: Mac and 4 Grade View: Grade II Tube type: Oral Number of attempts: 1 Airway Equipment and Method: Stylet Placement Confirmation: ETT inserted through vocal cords under direct vision, positive ETCO2, CO2 detector and breath sounds checked- equal and bilateral Secured at: 22 cm Tube secured with: Tape Dental Injury: Teeth and Oropharynx as per pre-operative assessment  Comments: Atraumatic intubation. Lips/teeth remain in preoperative condition.

## 2022-12-24 NOTE — Anesthesia Postprocedure Evaluation (Signed)
Anesthesia Post Note  Patient: Mary [name]  Procedure(s) Performed: XI ROBOTIC ASSISTED SALPINGECTOMY; TAP BLOCKS (Bilateral: Abdomen)  Patient location during evaluation: PACU Anesthesia Type: General Level of consciousness: awake and alert Pain management: pain level controlled Vital Signs Assessment: post-procedure vital signs reviewed and stable Respiratory status: spontaneous breathing, nonlabored ventilation, respiratory function stable and patient connected to nasal cannula oxygen Cardiovascular status: blood pressure returned to baseline and stable Postop Assessment: no apparent nausea or vomiting Anesthetic complications: no   There were no known notable events for this encounter.   Last Vitals:  Vitals:   12/24/22 0915 12/24/22 0926  BP: 116/76 119/78  Pulse: 73 93  Resp: 16 18  Temp:  36.5 C  SpO2: 95% 98%    Last Pain:  Vitals:   12/24/22 0926  TempSrc: Oral  PainSc: 0-No pain                 Suvi Archuletta L Keylor Rands

## 2022-12-24 NOTE — Transfer of Care (Addendum)
Immediate Anesthesia Transfer of Care Note  Patient: Mary [name]  Procedure(s) Performed: XI ROBOTIC ASSISTED SALPINGECTOMY; TAP BLOCKS (Bilateral: Abdomen)  Patient Location: PACU  Anesthesia Type:General  Level of Consciousness: awake and patient cooperative  Airway & Oxygen Therapy: Patient Spontanous Breathing and Patient connected to nasal cannula oxygen  Post-op Assessment: Report given to RN and Post -op Vital signs reviewed and stable  Post vital signs: Reviewed and stable  Last Vitals:  Vitals Value Taken Time  BP 110/80 12/24/22 0845  Temp 36.3 12/24/22   0845  Pulse 87 12/24/22 0848  Resp 16 12/24/22 0848  SpO2 100 % 12/24/22 0848  Vitals shown include unfiled device data.  Last Pain:  Vitals:   12/24/22 0644  TempSrc: Oral  PainSc: 0-No pain      Patients Stated Pain Goal: 6 (12/24/22 4540)  Complications: No notable events documented.

## 2022-12-24 NOTE — Anesthesia Preprocedure Evaluation (Addendum)
Anesthesia Evaluation  Patient identified by MRN, date of birth, ID band Patient awake    Reviewed: Allergy & Precautions, NPO status , Patient's Chart, lab work & pertinent test results  Airway Mallampati: II  TM Distance: >3 FB Neck ROM: Full    Dental no notable dental hx. (+) Dental Advisory Given, Teeth Intact Upper right first molar chipped:   Pulmonary neg pulmonary ROS   Pulmonary exam normal breath sounds clear to auscultation       Cardiovascular negative cardio ROS Normal cardiovascular exam Rhythm:Regular Rate:Normal     Neuro/Psych negative neurological ROS  negative psych ROS   GI/Hepatic negative GI ROS, Neg liver ROS,,,  Endo/Other  negative endocrine ROS    Renal/GU negative Renal ROS  negative genitourinary   Musculoskeletal negative musculoskeletal ROS (+)    Abdominal Normal abdominal exam  (+)   Peds negative pediatric ROS (+)  Hematology negative hematology ROS (+)   Anesthesia Other Findings Morbid obesity with BMI 40  Reproductive/Obstetrics negative OB ROS                             Anesthesia Physical Anesthesia Plan  ASA: 2  Anesthesia Plan: General   Post-op Pain Management:    Induction: Intravenous  PONV Risk Score and Plan: Ondansetron, Dexamethasone, Midazolam and Scopolamine patch - Pre-op  Airway Management Planned: Oral ETT  Additional Equipment:   Intra-op Plan:   Post-operative Plan: Extubation in OR  Informed Consent: I have reviewed the patients History and Physical, chart, labs and discussed the procedure including the risks, benefits and alternatives for the proposed anesthesia with the patient or authorized representative who has indicated his/her understanding and acceptance.     Dental advisory given  Plan Discussed with: CRNA  Anesthesia Plan Comments: (ASA 3 for BMI 40)        Anesthesia Quick Evaluation

## 2022-12-25 LAB — SURGICAL PATHOLOGY

## 2022-12-29 ENCOUNTER — Encounter (HOSPITAL_COMMUNITY): Payer: Self-pay | Admitting: Obstetrics & Gynecology

## 2023-01-02 ENCOUNTER — Encounter: Payer: Self-pay | Admitting: Obstetrics & Gynecology

## 2023-01-02 ENCOUNTER — Telehealth: Payer: Commercial Managed Care - PPO | Admitting: Obstetrics & Gynecology

## 2023-01-02 VITALS — Ht 64.0 in | Wt 211.0 lb

## 2023-01-02 DIAGNOSIS — Z9889 Other specified postprocedural states: Secondary | ICD-10-CM

## 2023-01-02 NOTE — Progress Notes (Signed)
MyChart connect visit Pt is riding in a car I am in my office Total time 5 minutes  HPI: Patient returns for routine postoperative follow-up having undergone RA BS on 12/24/22.  The patient's immediate postoperative recovery has been unremarkable. Since hospital discharge the patient reports no problems.   Current Outpatient Medications: Acetaminophen (TYLENOL PO), Take by mouth., Disp: , Rfl:  amoxicillin (AMOXIL) 500 MG capsule, Take 500 mg by mouth 4 (four) times daily., Disp: , Rfl:  ibuprofen (ADVIL) 800 MG tablet, Take 800 mg by mouth every 8 (eight) hours as needed. (Patient not taking: Reported on 01/02/2023), Disp: , Rfl:  ketorolac (TORADOL) 10 MG tablet, Take 1 tablet (10 mg total) by mouth every 8 (eight) hours as needed. (Patient not taking: Reported on 01/02/2023), Disp: 15 tablet, Rfl: 0 ondansetron (ZOFRAN-ODT) 8 MG disintegrating tablet, Take 1 tablet (8 mg total) by mouth every 8 (eight) hours as needed for nausea or vomiting. (Patient not taking: Reported on 01/02/2023), Disp: 8 tablet, Rfl: 0  No current facility-administered medications for this visit.    Height 5\' 4"  (1.626 m), weight 211 lb (95.7 kg), last menstrual period 11/15/2022.  Physical Exam: GEn WDWN NAD  Diagnostic Tests:   Pathology: benign  Impression + Management plan:   ICD-10-CM   1. Post-operative state: RA TLH 12/24/22 no problems  Z98.890           Medications Prescribed this encounter: No orders of the defined types were placed in this encounter.     Follow up: prn   Lazaro Arms, MD Attending Physician for the [facility] for [facility] 01/02/2023 10:45 AM

## 2023-06-17 ENCOUNTER — Telehealth: Admitting: Family Medicine

## 2023-06-17 DIAGNOSIS — B3731 Acute candidiasis of vulva and vagina: Secondary | ICD-10-CM

## 2023-06-17 MED ORDER — FLUCONAZOLE 150 MG PO TABS: 150.0000 mg | ORAL_TABLET | ORAL | 0 refills | Status: DC

## 2023-06-17 NOTE — Progress Notes (Signed)

## 2023-07-08 DIAGNOSIS — Z1159 Encounter for screening for other viral diseases: Secondary | ICD-10-CM | POA: Diagnosis not present

## 2023-08-05 ENCOUNTER — Emergency Department (HOSPITAL_BASED_OUTPATIENT_CLINIC_OR_DEPARTMENT_OTHER)
Admission: EM | Admit: 2023-08-05 | Discharge: 2023-08-05 | Disposition: A | Discharge status: Home / Self Care | Attending: Emergency Medicine | Admitting: Emergency Medicine

## 2023-08-05 ENCOUNTER — Encounter (HOSPITAL_BASED_OUTPATIENT_CLINIC_OR_DEPARTMENT_OTHER): Payer: Self-pay | Admitting: Emergency Medicine

## 2023-08-05 ENCOUNTER — Other Ambulatory Visit: Payer: Self-pay

## 2023-08-05 DIAGNOSIS — N939 Abnormal uterine and vaginal bleeding, unspecified: Secondary | ICD-10-CM | POA: Diagnosis not present

## 2023-08-05 DIAGNOSIS — R109 Unspecified abdominal pain: Secondary | ICD-10-CM

## 2023-08-05 DIAGNOSIS — R1030 Lower abdominal pain, unspecified: Secondary | ICD-10-CM | POA: Diagnosis not present

## 2023-08-05 LAB — BASIC METABOLIC PANEL WITH GFR
Anion gap: 12 (ref 5–15)
BUN: 12 mg/dL (ref 6–20)
CO2: 25 mmol/L (ref 22–32)
Calcium: 9.5 mg/dL (ref 8.9–10.3)
Chloride: 106 mmol/L (ref 98–111)
Creatinine, Ser: 0.83 mg/dL (ref 0.44–1.00)
GFR, Estimated: >60 mL/min (ref >60)
Glucose, Bld: 88 mg/dL (ref 70–99)
Potassium: 3.7 mmol/L (ref 3.5–5.1)
Sodium: 142 mmol/L (ref 135–145)

## 2023-08-05 LAB — CBC
HCT: 38 % (ref 36.0–46.0)
Hemoglobin: 13.3 g/dL (ref 12.0–15.0)
MCH: 30.7 pg (ref 26.0–34.0)
MCHC: 35 g/dL (ref 30.0–36.0)
MCV: 87.8 fL (ref 80.0–100.0)
Platelets: 292 10*3/uL (ref 150–400)
RBC: 4.33 MIL/uL (ref 3.87–5.11)
RDW: 12.5 % (ref 11.5–15.5)
WBC: 6.6 10*3/uL (ref 4.0–10.5)
nRBC: 0 % (ref 0.0–0.2)

## 2023-08-05 LAB — URINALYSIS, ROUTINE W REFLEX MICROSCOPIC
Bilirubin Urine: NEGATIVE
Glucose, UA: NEGATIVE mg/dL
Ketones, ur: NEGATIVE mg/dL
Leukocytes,Ua: NEGATIVE
Nitrite: POSITIVE — AB
Protein, ur: NEGATIVE mg/dL
RBC / HPF: >50 RBC/hpf (ref 0–5)
Specific Gravity, Urine: 1.022 (ref 1.005–1.030)
pH: 6 (ref 5.0–8.0)

## 2023-08-05 LAB — PREGNANCY, URINE: Preg Test, Ur: NEGATIVE

## 2023-08-05 MED ORDER — CEPHALEXIN 500 MG PO CAPS: 500.0000 mg | ORAL_CAPSULE | Freq: Four times a day (QID) | ORAL | 0 refills | Status: DC

## 2023-08-05 MED ORDER — CEPHALEXIN 250 MG PO CAPS
500.0000 mg | ORAL_CAPSULE | Freq: Once | ORAL | Status: AC
  Administered 2023-08-05: 500 mg via ORAL
  Filled 2023-08-05: qty 2

## 2023-08-05 NOTE — ED Provider Notes (Signed)
 Mary [name] Provider Note   CSN: [id number] Arrival date & time: 08/05/23  1044     Patient presents with: Cyst  HPI Mary [name] is a 38 y.o. female presenting for large bulge or cyst in her vagina.  She states she noticed this yesterday.  She denies vaginal foreign body.  She states it feels like pressure and feels like it is inside her vaginal canal.  She also mention that she did start her period yesterday as well.  Has endorsed some light bleeding with her period and some lower abdominal cramps.  She denies urinary symptoms.  Denies nausea vomiting diarrhea.  Denies shortness of breath, fatigue , lightheadedness or syncope.    HPI     Prior to Admission medications   Medication Sig Start Date End Date Taking? Authorizing Provider  cephALEXin  (KEFLEX ) 500 MG capsule Take 1 capsule (500 mg total) by mouth 4 (four) times daily. 08/05/23  Yes Darriana Deboy K, PA-C  Acetaminophen  (TYLENOL  PO) Take by mouth.    [provider]  amoxicillin  (AMOXIL ) 500 MG capsule Take 500 mg by mouth 4 (four) times daily. 12/31/22   [provider]  fluconazole  (DIFLUCAN ) 150 MG tablet Take 1 tablet (150 mg total) by mouth as directed. Repeat in 3 days as needed 06/17/23   Moishe Chiquita HERO, NP  ketorolac  (TORADOL ) 10 MG tablet Take 1 tablet (10 mg total) by mouth every 8 (eight) hours as needed. Patient not taking: Reported on 01/02/2023 12/24/22   Jayne Vonn DEL, MD  ondansetron  (ZOFRAN -ODT) 8 MG disintegrating tablet Take 1 tablet (8 mg total) by mouth every 8 (eight) hours as needed for nausea or vomiting. Patient not taking: Reported on 01/02/2023 12/24/22   Jayne Vonn DEL, MD    Allergies: Patient has no known allergies.    Review of Systems See HPI  Updated Vital Signs BP 137/77   Pulse 83 Comment: Simultaneous filing. User may not have seen previous data.  Temp 98.4 F (36.9 C)   Resp 16   SpO2 98%   Physical Exam Exam  conducted with a chaperone present.  Constitutional:      Appearance: Normal appearance.  HENT:     Head: Normocephalic.     Nose: Nose normal.   Eyes:     Conjunctiva/sclera: Conjunctivae normal.   Pulmonary:     Effort: Pulmonary effort is normal.  Abdominal:     General: There is no distension.     Palpations: Abdomen is soft.     Tenderness: There is no abdominal tenderness. There is no right CVA tenderness or left CVA tenderness.  Genitourinary:    General: Normal vulva.     Labia:        Right: No rash, tenderness or lesion.        Left: No rash, tenderness or lesion.      Urethra: No prolapse, urethral pain, urethral swelling or urethral lesion.     Cervix: Cervical bleeding present. No cervical motion tenderness, discharge, friability, lesion, erythema or eversion.   Neurological:     Mental Status: She is alert.   Psychiatric:        Mood and Affect: Mood normal.     (all labs ordered are listed, but only abnormal results are displayed) Labs Reviewed  URINALYSIS, ROUTINE W REFLEX MICROSCOPIC - Abnormal; Notable for the following components:      Result Value   APPearance HAZY (*)    Hgb urine dipstick  LARGE (*)    Nitrite POSITIVE (*)    Bacteria, UA MANY (*)    All other components within normal limits  CBC  PREGNANCY, URINE  BASIC METABOLIC PANEL WITH GFR    EKG: None  Radiology: No results found.   Procedures   Medications Ordered in the ED  cephALEXin  (KEFLEX ) capsule 500 mg (has no administration in time range)                                    Medical Decision Making  38 year old well-appearing female presenting for cyst or bulge in the vaginal canal.  Exam notable for scant bleeding in the vaginal canal.  Likely menstrual bleeding given she started her period yesterday.  DDx includes foreign body, Bartholin cyst, PID, prolapse, ectopic pregnancy, decidua, other.  Overall she looks well, pelvic exam was unremarkable.  Negative pregnancy  test.  Urinalysis revealed concern for UTI.  This this could be contributing to her pelvic symptoms.  Started her on Keflex .  Considered other more sinister acute intra-abdominal pathology but unlikely given reassuring vitals, benign abdominal exam and lack of symptoms.  Advised her to follow-up with her PCP for her UTI and to follow-up with her OB/GYN for sensation of vaginal fullness or foreign body sensation.  Discussed return precautions.  Discharge condition.     Final diagnoses:  Abdominal cramping    ED Discharge Orders          Ordered    cephALEXin  (KEFLEX ) 500 MG capsule  4 times daily        08/05/23 1508               Dustina Scoggin K, PA-C 08/05/23 1509    Tegeler, Lonni PARAS, MD 08/05/23 [phone]

## 2023-08-05 NOTE — ED Triage Notes (Signed)
 States noticed large bulge or cyst in vagina since yesterday. C/o lower abd cramps.

## 2023-08-05 NOTE — Discharge Instructions (Signed)
 Evaluation today revealed concern that you may have a UTI.  This could be contributing to your symptoms in someway.  I sent Keflex  to your pharmacy for treatment.  Recommend you follow-up your PCP for UTI symptoms.  For your vaginal fullness and/or foreign body sensation please follow-up with your OB/GYN provider.  If you develop abdominal pain, abnormal vaginal bleeding, persistent nausea vomiting diarrhea, fever or any other concerning symptom please return to the ED for further evaluation.

## 2023-08-13 ENCOUNTER — Encounter: Payer: Self-pay | Admitting: Family Medicine

## 2023-08-13 ENCOUNTER — Telehealth: Admitting: Family Medicine

## 2023-08-13 DIAGNOSIS — W57XXXA Bitten or stung by nonvenomous insect and other nonvenomous arthropods, initial encounter: Secondary | ICD-10-CM | POA: Diagnosis not present

## 2023-08-13 DIAGNOSIS — S40861A Insect bite (nonvenomous) of right upper arm, initial encounter: Secondary | ICD-10-CM

## 2023-08-13 MED ORDER — TRIAMCINOLONE ACETONIDE 0.1 % EX CREA: TOPICAL_CREAM | CUTANEOUS | 0 refills | Status: AC

## 2023-08-13 NOTE — Progress Notes (Signed)
 E-Visit for Insect Sting  Thank you for describing the insect sting for us .  Here is how we plan to help!  Based on the information you have shared with me it looks like you have: an insect sting that would benefit from a stronger topical steroid cream. I will send you in a prescription for triamcinolone to be applied in a thin layer twice daily as needed for itching. You will use this in place of the hydrocortisone cream you have been using.   The 2 greatest risks from insect stings are allergic reaction, which can be fatal in some people and infection, which is more common and less serious.  Bees, wasps, yellow jackets, and hornets belong to a class of insects called Hymenoptera.  Most insect stings cause only minor discomfort.  Stings can happen anywhere on the body and can be painful.  Most stings are from honey bees or yellow jackets.  Fire ants can sting multiple times.  The sites of the stings are more likely to become infected.     What can be used to prevent Insect Stings?  Insect repellant with at least 20% DEET.  Wearing long pants and shirts with socks and shoes.  Wear dark or drab-colored clothes rather than bright colors.  Avoid using perfumes and hair sprays; these attract insects.  HOME CARE ADVICE:  1. Stinger removal: The stinger looks like a tiny black dot in the sting. Use a fingernail, credit card edge, or knife-edge to scrape it off.  Don't pull it out because it squeezes out more venom. If the stinger is below the skin surface, leave it alone.  It will be shed with normal skin healing. 2. Use cold compresses to the area of the sting for 10-20 minutes.  You may repeat this as needed to relieve symptoms of pain and swelling. 3.  For pain relief, take acetominophen 650 mg 4-6 hours as needed or ibuprofen  400 mg every 6-8 hours as needed or naproxen 250-500 mg every 12 hours as needed. 4.  You can also use hydrocortisone cream 0.5% or 1% up to 4 times daily as needed  for itching. 5.  If the sting becomes very itchy, take Benadryl  25-50 mg, follow directions on box. 6.  Wash the area 2-3 times daily with antibacterial soap and warm water . 7. Call your Doctor if: Fever, a severe headache, or rash occur in the next 2 weeks. Sting area begins to look infected. Redness and swelling worsens after home treatment. Your current symptoms become worse.   MAKE SURE YOU:  Understand these instructions. Will watch your condition. Will get help right away if you are not doing well or get worse.  Thank you for choosing an e-visit.  Your e-visit answers were reviewed by a board certified advanced clinical practitioner to complete your personal care plan. Depending upon the condition, your plan could have included both over the counter or prescription medications.  Please review your pharmacy choice. Make sure the pharmacy is open so you can pick up prescription now. If there is a problem, you may contact your provider through [employer] and have the prescription routed to another pharmacy.  Your safety is important to us . If you have drug allergies check your prescription carefully.   For the next 24 hours you can use MyChart to ask questions about today's visit, request a non-urgent call back, or ask for a work or school excuse. You will get an email in the next two days asking about  your experience. I hope that your e-visit has been valuable and will speed your recovery.   I have spent 5 minutes in review of e-visit questionnaire, review and updating patient chart, medical decision making and response to patient.   Olam DELENA Darby, FNP

## 2023-08-17 ENCOUNTER — Telehealth: Admitting: Physician Assistant

## 2023-08-17 DIAGNOSIS — B379 Candidiasis, unspecified: Secondary | ICD-10-CM

## 2023-08-17 MED ORDER — FLUCONAZOLE 150 MG PO TABS: 150.0000 mg | ORAL_TABLET | ORAL | 0 refills | Status: AC | PRN

## 2023-08-17 NOTE — Progress Notes (Signed)

## 2023-12-03 ENCOUNTER — Ambulatory Visit: Admitting: Nurse Practitioner

## 2023-12-03 VITALS — BP 124/84 | HR 67 | Temp 98.1°F | Ht 64.0 in | Wt 220.0 lb

## 2023-12-03 DIAGNOSIS — Z13220 Encounter for screening for lipoid disorders: Secondary | ICD-10-CM | POA: Diagnosis not present

## 2023-12-03 DIAGNOSIS — R5383 Other fatigue: Secondary | ICD-10-CM

## 2023-12-03 DIAGNOSIS — Z01411 Encounter for gynecological examination (general) (routine) with abnormal findings: Secondary | ICD-10-CM

## 2023-12-03 DIAGNOSIS — Z01419 Encounter for gynecological examination (general) (routine) without abnormal findings: Secondary | ICD-10-CM

## 2023-12-03 NOTE — Progress Notes (Signed)
 Subjective:    Patient ID: Mary [name], female    DOB: 1985/04/08, 38 y.o.   MRN: [id number]  HPI  The patient comes in today for a wellness visit.    A review of their health history was completed.  A review of medications was also completed.  Any needed refills; no  Eating habits: fair  Falls/  MVA accidents in past few months: no  Regular exercise: walk once a week  Specialist pt sees on regular basis: no  Preventative health issues were discussed.   Additional concerns: none  Discussed the use of AI scribe software for clinical note transcription with the patient, who gave verbal consent to proceed.  History of Present Illness Mary [name] is a 38 year old female who presents for an annual physical exam.  Her last Pap smear was two years ago, which was normal with negative HPV testing. She has had regular eye and dental exams.  She has experienced a weight gain of nine pounds over the past year, which she attributes to stress and dietary habits. As a single mother of two children, she experiences significant stress from work and personal life, compounded by a recent breakup with her female partner of almost a year. No thoughts of self-harm or harm to others.  She has a history of salpingectomy for birth control and reports regular menstrual cycles with occasional increased cramping. Her menstrual flow lasts about three days, and she uses ibuprofen  for cramps as needed.   She denies any current concerns about STDs and has had full workups in the past, including HIV testing, which were all normal. She has completed the hepatitis B vaccine series as part of her nursing requirements.  She reports urinary frequency and occasional leakage with coughing or sneezing but does not require pads. She experiences daily indigestion, which she attributes to stress and dietary habits, and is on medication for it. She drinks diet [facility] and water , and denies smoking, alcohol, or  drug use.  She is a Engineer, civil (consulting) in long-term care and stays active at work, walking frequently. Had her flu vaccine on 11/02/23.  Review of Systems  Constitutional:  Positive for fatigue. Negative for activity change and appetite change.  HENT:  Negative for sore throat and trouble swallowing.   Respiratory:  Negative for cough, chest tightness, shortness of breath and wheezing.   Cardiovascular:  Negative for chest pain.  Gastrointestinal:  Negative for abdominal distention, abdominal pain, blood in stool, constipation, diarrhea, nausea and vomiting.  Genitourinary:  Positive for enuresis and frequency. Negative for difficulty urinating, dysuria, genital sores, menstrual problem, pelvic pain, urgency and vaginal discharge.      12/03/2023    9:28 AM  Depression screen PHQ 2/9  Decreased Interest 0  Down, Depressed, Hopeless 0  PHQ - 2 Score 0  Altered sleeping 0  Tired, decreased energy 0  Change in appetite 0  Feeling bad or failure about yourself  0  Trouble concentrating 0  Moving slowly or fidgety/restless 0  Suicidal thoughts 0  PHQ-9 Score 0  Difficult doing work/chores Not difficult at all      12/03/2023    9:28 AM 09/26/2022    8:33 AM  GAD 7 : Generalized Anxiety Score  Nervous, Anxious, on Edge 0 0  Control/stop worrying 1 0  Worry too much - different things 1 0  Trouble relaxing 0 0  Restless 0 0  Easily annoyed or irritable 0 0  Afraid - awful  might happen 0 0  Total GAD 7 Score 2 0  Anxiety Difficulty Not difficult at all Not difficult at all    Social History   Tobacco Use   Smoking status: Never   Smokeless tobacco: Never  Vaping Use   Vaping status: Never Used  Substance Use Topics   Alcohol use: No   Drug use: No        Objective:   Physical Exam Vitals and nursing note reviewed.  Constitutional:      General: She is not in acute distress.    Appearance: She is well-developed.  Neck:     Thyroid: No thyromegaly.     Trachea: No tracheal  deviation.     Comments: Thyroid non tender to palpation. No mass or goiter noted.  Cardiovascular:     Rate and Rhythm: Normal rate and regular rhythm.     Heart sounds: Normal heart sounds. No murmur heard. Pulmonary:     Effort: Pulmonary effort is normal.     Breath sounds: Normal breath sounds.  Chest:  Breasts:    Right: No swelling, inverted nipple, mass, skin change or tenderness.     Left: No swelling, inverted nipple, mass, skin change or tenderness.  Abdominal:     General: There is no distension.     Palpations: Abdomen is soft.     Tenderness: There is no abdominal tenderness.  Musculoskeletal:     Cervical back: Normal range of motion and neck supple.  Lymphadenopathy:     Cervical: No cervical adenopathy.     Upper Body:     Right upper body: No supraclavicular, axillary or pectoral adenopathy.     Left upper body: No supraclavicular, axillary or pectoral adenopathy.  Skin:    General: Skin is warm and dry.  Neurological:     Mental Status: She is alert and oriented to person, place, and time.  Psychiatric:        Mood and Affect: Mood normal.        Behavior: Behavior normal.        Thought Content: Thought content normal.        Judgment: Judgment normal.    Today's Vitals   12/03/23 0918  BP: 124/84  Pulse: 67  Temp: 98.1 F (36.7 C)  SpO2: 99%  Weight: 220 lb (99.8 kg)  Height: 5' 4 (1.626 m)   Body mass index is 37.76 kg/m.        Assessment & Plan:  1. Well woman exam (Primary) Routine wellness visit with well-controlled blood pressure. Discussed HPV vaccination, defers for now. - Order cholesterol and thyroid tests. - Schedule Pap smear for next year. - Encourage regular physical activity and healthy diet. - Has a great support system to help her deal with breakup of relationship  - Contact office if she wants counseling   2. Fatigue, unspecified type  - CBC with Differential/Platelet - Comprehensive metabolic panel with GFR -  TSH  3. Screening for lipid disorders  - Lipid panel   Return in about 1 year (around 12/02/2024) for physical.

## 2023-12-04 ENCOUNTER — Encounter: Payer: Self-pay | Admitting: Nurse Practitioner

## 2023-12-04 LAB — LIPID PANEL
Chol/HDL Ratio: 3.1 ratio (ref 0.0–4.4)
Cholesterol, Total: 171 mg/dL (ref 100–199)
HDL: 56 mg/dL (ref >39)
LDL Chol Calc (NIH): 103 mg/dL — ABNORMAL HIGH (ref 0–99)
Triglycerides: 61 mg/dL (ref 0–149)
VLDL Cholesterol Cal: 12 mg/dL (ref 5–40)

## 2023-12-04 LAB — COMPREHENSIVE METABOLIC PANEL WITH GFR
ALT: 20 IU/L (ref 0–32)
AST: 20 IU/L (ref 0–40)
Albumin: 4.8 g/dL (ref 3.9–4.9)
Alkaline Phosphatase: 82 IU/L (ref 41–116)
BUN/Creatinine Ratio: 10 (ref 9–23)
BUN: 8 mg/dL (ref 6–20)
Bilirubin Total: 0.6 mg/dL (ref 0.0–1.2)
CO2: 23 mmol/L (ref 20–29)
Calcium: 10.1 mg/dL (ref 8.7–10.2)
Chloride: 102 mmol/L (ref 96–106)
Creatinine, Ser: 0.83 mg/dL (ref 0.57–1.00)
Globulin, Total: 2.9 g/dL (ref 1.5–4.5)
Glucose: 91 mg/dL (ref 70–99)
Potassium: 4.2 mmol/L (ref 3.5–5.2)
Sodium: 140 mmol/L (ref 134–144)
Total Protein: 7.7 g/dL (ref 6.0–8.5)
eGFR: 94 mL/min/1.73 (ref >59)

## 2023-12-04 LAB — CBC WITH DIFFERENTIAL/PLATELET
Basophils Absolute: 0 x10E3/uL (ref 0.0–0.2)
Basos: 1 %
EOS (ABSOLUTE): 0 x10E3/uL (ref 0.0–0.4)
Eos: 0 %
Hematocrit: 40.6 % (ref 34.0–46.6)
Hemoglobin: 13.4 g/dL (ref 11.1–15.9)
Immature Grans (Abs): 0 x10E3/uL (ref 0.0–0.1)
Immature Granulocytes: 0 %
Lymphocytes Absolute: 2.2 x10E3/uL (ref 0.7–3.1)
Lymphs: 26 %
MCH: 30.8 pg (ref 26.6–33.0)
MCHC: 33 g/dL (ref 31.5–35.7)
MCV: 93 fL (ref 79–97)
Monocytes Absolute: 0.4 x10E3/uL (ref 0.1–0.9)
Monocytes: 4 %
Neutrophils Absolute: 5.7 x10E3/uL (ref 1.4–7.0)
Neutrophils: 69 %
Platelets: 304 x10E3/uL (ref 150–450)
RBC: 4.35 x10E6/uL (ref 3.77–5.28)
RDW: 12.2 % (ref 11.7–15.4)
WBC: 8.3 x10E3/uL (ref 3.4–10.8)

## 2023-12-04 LAB — TSH: TSH: 1.78 u[IU]/mL (ref 0.450–4.500)

## 2023-12-06 ENCOUNTER — Ambulatory Visit: Payer: Self-pay | Admitting: Nurse Practitioner

## 2024-01-25 ENCOUNTER — Encounter

## 2024-02-21 ENCOUNTER — Encounter: Payer: Self-pay | Admitting: Nurse Practitioner

## 2024-02-25 ENCOUNTER — Other Ambulatory Visit (HOSPITAL_COMMUNITY): Payer: Self-pay

## 2024-02-25 ENCOUNTER — Other Ambulatory Visit: Payer: Self-pay | Admitting: Nurse Practitioner

## 2024-02-25 ENCOUNTER — Other Ambulatory Visit (HOSPITAL_BASED_OUTPATIENT_CLINIC_OR_DEPARTMENT_OTHER): Payer: Self-pay

## 2024-02-25 MED ORDER — VALACYCLOVIR HCL 1 G PO TABS
1000.0000 mg | ORAL_TABLET | Freq: Every day | ORAL | 3 refills | Status: AC
  Filled 2024-02-25 (×2): qty 90, 90d supply, fill #0

## 2024-02-28 ENCOUNTER — Other Ambulatory Visit (HOSPITAL_COMMUNITY): Payer: Self-pay
